# Patient Record
Sex: Female | Born: 1975
Health system: Southern US, Community
[De-identification: ages and names within clinical notes are randomized; demographics above are authoritative.]

## PROBLEM LIST (undated history)

## (undated) DIAGNOSIS — I10 Essential (primary) hypertension: Secondary | ICD-10-CM

## (undated) DIAGNOSIS — K219 Gastro-esophageal reflux disease without esophagitis: Secondary | ICD-10-CM

## (undated) HISTORY — PX: TONSILLECTOMY: SUR1361

## (undated) HISTORY — PX: COLONOSCOPY: SHX5424

## (undated) HISTORY — DX: Gastro-esophageal reflux disease without esophagitis: K21.9

---

## 2003-11-21 HISTORY — PX: UPPER GASTROINTESTINAL ENDOSCOPY: SHX188

## 2011-10-11 ENCOUNTER — Emergency Department (HOSPITAL_BASED_OUTPATIENT_CLINIC_OR_DEPARTMENT_OTHER)
Admission: EM | Admit: 2011-10-11 | Discharge: 2011-10-11 | Disposition: A | Discharge status: Home / Self Care | Payer: Managed Care, Other (non HMO) | Attending: Emergency Medicine | Admitting: Emergency Medicine

## 2011-10-11 ENCOUNTER — Encounter: Payer: Self-pay | Admitting: *Deleted

## 2011-10-11 ENCOUNTER — Emergency Department (INDEPENDENT_AMBULATORY_CARE_PROVIDER_SITE_OTHER): Payer: Managed Care, Other (non HMO)

## 2011-10-11 ENCOUNTER — Emergency Department (HOSPITAL_BASED_OUTPATIENT_CLINIC_OR_DEPARTMENT_OTHER): Payer: Managed Care, Other (non HMO)

## 2011-10-11 DIAGNOSIS — E785 Hyperlipidemia, unspecified: Secondary | ICD-10-CM | POA: Insufficient documentation

## 2011-10-11 DIAGNOSIS — N390 Urinary tract infection, site not specified: Secondary | ICD-10-CM | POA: Insufficient documentation

## 2011-10-11 DIAGNOSIS — I10 Essential (primary) hypertension: Secondary | ICD-10-CM | POA: Insufficient documentation

## 2011-10-11 DIAGNOSIS — R109 Unspecified abdominal pain: Secondary | ICD-10-CM | POA: Insufficient documentation

## 2011-10-11 DIAGNOSIS — F17200 Nicotine dependence, unspecified, uncomplicated: Secondary | ICD-10-CM | POA: Insufficient documentation

## 2011-10-11 DIAGNOSIS — N83209 Unspecified ovarian cyst, unspecified side: Secondary | ICD-10-CM | POA: Insufficient documentation

## 2011-10-11 DIAGNOSIS — K661 Hemoperitoneum: Secondary | ICD-10-CM

## 2011-10-11 HISTORY — DX: Essential (primary) hypertension: I10

## 2011-10-11 LAB — URINALYSIS, ROUTINE W REFLEX MICROSCOPIC
Hgb urine dipstick: NEGATIVE
Specific Gravity, Urine: 1.029 (ref 1.005–1.030)
Urobilinogen, UA: 4 mg/dL — ABNORMAL HIGH (ref 0.0–1.0)
pH: 5 (ref 5.0–8.0)

## 2011-10-11 LAB — BASIC METABOLIC PANEL
BUN: 11 mg/dL (ref 6–23)
Calcium: 8.8 mg/dL (ref 8.4–10.5)
GFR calc Af Amer: >90 mL/min (ref >90)
GFR calc non Af Amer: >90 mL/min (ref >90)
Glucose, Bld: 97 mg/dL (ref 70–99)
Potassium: 3.5 mEq/L (ref 3.5–5.1)
Sodium: 134 mEq/L — ABNORMAL LOW (ref 135–145)

## 2011-10-11 LAB — DIFFERENTIAL
Basophils Relative: 0 % (ref 0–1)
Eosinophils Absolute: 0.1 10*3/uL (ref 0.0–0.7)
Neutro Abs: 7.8 10*3/uL — ABNORMAL HIGH (ref 1.7–7.7)
Neutrophils Relative %: 63 % (ref 43–77)

## 2011-10-11 LAB — CBC
MCH: 31.8 pg (ref 26.0–34.0)
MCHC: 34.4 g/dL (ref 30.0–36.0)
Platelets: 312 10*3/uL (ref 150–400)
RBC: 4.02 MIL/uL (ref 3.87–5.11)

## 2011-10-11 LAB — HEPATIC FUNCTION PANEL
AST: 14 U/L (ref 0–37)
Albumin: 3.9 g/dL (ref 3.5–5.2)
Total Protein: 7.3 g/dL (ref 6.0–8.3)

## 2011-10-11 LAB — URINE MICROSCOPIC-ADD ON

## 2011-10-11 LAB — PREGNANCY, URINE: Preg Test, Ur: NEGATIVE

## 2011-10-11 MED ORDER — KETOROLAC TROMETHAMINE 30 MG/ML IJ SOLN
30.0000 mg | Freq: Once | INTRAMUSCULAR | Status: AC
  Administered 2011-10-11: 30 mg via INTRAVENOUS
  Filled 2011-10-11: qty 1

## 2011-10-11 MED ORDER — IBUPROFEN 800 MG PO TABS: 800.0000 mg | ORAL_TABLET | Freq: Three times a day (TID) | ORAL | Status: AC

## 2011-10-11 MED ORDER — OXYCODONE-ACETAMINOPHEN 5-325 MG PO TABS: 2.0000 | ORAL_TABLET | ORAL | Status: AC | PRN

## 2011-10-11 NOTE — ED Notes (Signed)
C/o sudden onset of right flank and suprapubic pains since this morning. Was seen at [facility] today, had urine specimen obtained and dx with kidney infection. Was prescribed Cipro and ?Pyridium without relief of pain. States she is having severe right sided flank pain that radiates into the suprapubic area. C/o pressure as well. Denies urinary symptoms. Here d/t continued severe pain. No hx of stones.

## 2011-10-11 NOTE — ED Notes (Signed)
Pt c/o lower abd and right flank pain since 4:00 am today. Pt sts pain worse with urination.

## 2011-10-11 NOTE — ED Provider Notes (Signed)
History     CSN: [id number] Arrival date & time: 10/11/2011  7:28 PM   First MD Initiated Contact with Patient 10/11/11 2010      Chief Complaint  Patient presents with  . Abdominal Pain    (Consider location/radiation/quality/duration/timing/severity/associated sxs/prior treatment) HPI Comments: Pt states that she was seen today at [facility],but didn't like the care and the pain in her flank pain has worsened so she felt like she need to be seen:pt state that she has not had any urinary symptoms or fever  Patient is a 35 y.o. female presenting with abdominal pain. The history is provided by the patient.  Abdominal Pain The primary symptoms of the illness include abdominal pain. The primary symptoms of the illness do not include fever, nausea, vomiting, dysuria, vaginal discharge or vaginal bleeding. The current episode started 13 to 24 hours ago. The onset of the illness was sudden. The problem has been gradually worsening.  The patient states that she believes she is currently not pregnant. The patient has not had a change in bowel habit. Additional symptoms associated with the illness include back pain. Symptoms associated with the illness do not include urgency or hematuria.    Past Medical History  Diagnosis Date  . Hypertension   . Hyperlipidemia     Past Surgical History  Procedure Date  . Tonsillectomy     No family history on file.  History  Substance Use Topics  . Smoking status: Current Everyday Smoker  . Smokeless tobacco: Not on file  . Alcohol Use: Yes    OB History    Grav Para Term Preterm Abortions TAB SAB Ect Mult Living                  Review of Systems  Constitutional: Negative for fever.  Gastrointestinal: Positive for abdominal pain. Negative for nausea and vomiting.  Genitourinary: Negative for dysuria, urgency, hematuria, vaginal bleeding and vaginal discharge.  Musculoskeletal: Positive for back pain.  All other systems reviewed  and are negative.    Allergies  Vicodin  Home Medications   Current Outpatient Rx  Name Route Sig Dispense Refill  . CIPROFLOXACIN HCL 500 MG PO TABS Oral Take 500 mg by mouth 2 (two) times daily.      Marland Kitchen PHENAZOPYRIDINE HCL 200 MG PO TABS Oral Take 200 mg by mouth 3 (three) times daily.        BP 136/83  Pulse 73  Temp(Src) 97.5 F (36.4 C) (Oral)  Resp 18  SpO2 100%  LMP 10/06/2011  Physical Exam  Nursing note and vitals reviewed. Constitutional: She is oriented to person, place, and time. She appears well-developed and well-nourished.  HENT:  Head: Normocephalic.  Cardiovascular: Regular rhythm.   Pulmonary/Chest: Effort normal and breath sounds normal.  Abdominal: Soft. There is tenderness in the right lower quadrant and left lower quadrant.  Musculoskeletal: Normal range of motion.  Neurological: She is alert and oriented to person, place, and time.  Skin: Skin is warm and dry.  Psychiatric: She has a normal mood and affect.    ED Course  Procedures (including critical care time)  Labs Reviewed  URINALYSIS, ROUTINE W REFLEX MICROSCOPIC - Abnormal; Notable for the following:    Color, Urine RED (*) BIOCHEMICALS MAY BE AFFECTED BY COLOR   Appearance TURBID (*)    Bilirubin Urine MODERATE (*)    Ketones, ur 40 (*)    Protein, ur 30 (*)    Urobilinogen, UA 4.0 (*)  Nitrite POSITIVE (*)    Leukocytes, UA LARGE (*)    All other components within normal limits  URINE MICROSCOPIC-ADD ON - Abnormal; Notable for the following:    Squamous Epithelial / LPF FEW (*)    Bacteria, UA FEW (*)    All other components within normal limits  BASIC METABOLIC PANEL - Abnormal; Notable for the following:    Sodium 134 (*)    All other components within normal limits  CBC - Abnormal; Notable for the following:    WBC 12.3 (*)    All other components within normal limits  DIFFERENTIAL - Abnormal; Notable for the following:    Neutro Abs 7.8 (*)    All other components  within normal limits  PREGNANCY, URINE  HEPATIC FUNCTION PANEL   Ct Abdomen Pelvis W Wo Contrast  10/11/2011  *RADIOLOGY REPORT*  Clinical Data: Right flank pain radiating into the suprapubic region.  CT ABDOMEN AND PELVIS WITHOUT AND WITH CONTRAST 10/11/2011:  Technique:  Multidetector CT imaging of the abdomen and pelvis was performed without contrast material in both body regions, followed by contrast material(s) and further sections in both body regions. (Originally, this was performed as an unenhanced study, but because of findings below, the patient returned for a contrasted examination.)  Contrast:  100 ml Omnipaque-300 IV.  Comparison: None.  Findings: Original unenhanced images demonstrated a large amount of high attenuation fluid dependently in the pelvis, in the left paracolic gutter, and adjacent to the spleen, consistent with hemoperitoneum.  Minimal hemoperitoneum also present adjacent to the inferior liver.  No opaque urinary tract calculi identified. Enhanced images demonstrate a normal appearing liver and spleen without evidence of laceration or rupture.  Uterus enhances normally.  Bilateral ovarian cysts suspected. Urinary bladder unremarkable.  Phleboliths low in the pelvis.  Normal appearing liver, pancreas, adrenal glands, and kidneys. Gallbladder unremarkable by CT.  No biliary ductal dilation.  No visible aorto-iliofemoral atherosclerosis.  No significant lymphadenopathy.  Normal-appearing stomach, small bowel, and colon.  Normal appendix in the right mid abdomen, as the cecum is positioned in the right upper quadrant.  Visualized lung bases clear.  Bone window images demonstrate mild lower thoracic spondylosis and mild to moderate facet degenerative changes at L5-S1.  IMPRESSION:  1.  Moderate amount of hemoperitoneum, without evidence of laceration of the liver or spleen.  Therefore, the most likely etiology is a ruptured ovarian hemorrhagic cyst or bleeding related to an  endometriosis. 2.  Otherwise normal examination.  No evidence of urinary tract calculi.  Original Report Authenticated By: Arnell Sieving, M.D.     1. Ruptured ovarian cyst   2. Hemoperitoneum   3. UTI (lower urinary tract infection)       MDM  Spoke with Dr. Emelda Fear with ob teaching service and he said that it is symptomatic treatment and to explain symptoms that would warrant return        Teressa Lower, NP 10/11/11 2207

## 2011-10-12 NOTE — ED Provider Notes (Signed)
Medical screening examination/treatment/procedure(s) were performed by non-physician practitioner and as supervising physician I was immediately available for consultation/collaboration.   Rolan Bucco, MD 10/12/11 [phone]

## 2015-03-29 ENCOUNTER — Encounter (HOSPITAL_BASED_OUTPATIENT_CLINIC_OR_DEPARTMENT_OTHER): Payer: Self-pay

## 2015-03-29 ENCOUNTER — Emergency Department (HOSPITAL_BASED_OUTPATIENT_CLINIC_OR_DEPARTMENT_OTHER)
Admission: EM | Admit: 2015-03-29 | Discharge: 2015-03-29 | Disposition: A | Discharge status: Home / Self Care | Payer: Managed Care, Other (non HMO) | Attending: Emergency Medicine | Admitting: Emergency Medicine

## 2015-03-29 DIAGNOSIS — K921 Melena: Secondary | ICD-10-CM | POA: Diagnosis not present

## 2015-03-29 DIAGNOSIS — Z79899 Other long term (current) drug therapy: Secondary | ICD-10-CM | POA: Diagnosis not present

## 2015-03-29 DIAGNOSIS — K5900 Constipation, unspecified: Secondary | ICD-10-CM | POA: Diagnosis not present

## 2015-03-29 DIAGNOSIS — N61 Inflammatory disorders of breast: Secondary | ICD-10-CM | POA: Diagnosis not present

## 2015-03-29 DIAGNOSIS — N611 Abscess of the breast and nipple: Secondary | ICD-10-CM

## 2015-03-29 DIAGNOSIS — K625 Hemorrhage of anus and rectum: Secondary | ICD-10-CM | POA: Diagnosis present

## 2015-03-29 DIAGNOSIS — L0292 Furuncle, unspecified: Secondary | ICD-10-CM

## 2015-03-29 DIAGNOSIS — I10 Essential (primary) hypertension: Secondary | ICD-10-CM | POA: Diagnosis not present

## 2015-03-29 DIAGNOSIS — R198 Other specified symptoms and signs involving the digestive system and abdomen: Secondary | ICD-10-CM

## 2015-03-29 DIAGNOSIS — L0293 Carbuncle, unspecified: Secondary | ICD-10-CM

## 2015-03-29 DIAGNOSIS — Z792 Long term (current) use of antibiotics: Secondary | ICD-10-CM | POA: Insufficient documentation

## 2015-03-29 DIAGNOSIS — Z720 Tobacco use: Secondary | ICD-10-CM | POA: Diagnosis not present

## 2015-03-29 DIAGNOSIS — Z3202 Encounter for pregnancy test, result negative: Secondary | ICD-10-CM | POA: Insufficient documentation

## 2015-03-29 LAB — COMPREHENSIVE METABOLIC PANEL
ALT: 16 U/L (ref 14–54)
AST: 15 U/L (ref 15–41)
Albumin: 3.7 g/dL (ref 3.5–5.0)
Alkaline Phosphatase: 94 U/L (ref 38–126)
Anion gap: 7 (ref 5–15)
BUN: 13 mg/dL (ref 6–20)
CO2: 26 mmol/L (ref 22–32)
Calcium: 8.8 mg/dL — ABNORMAL LOW (ref 8.9–10.3)
Chloride: 106 mmol/L (ref 101–111)
Creatinine, Ser: 0.83 mg/dL (ref 0.44–1.00)
GFR calc Af Amer: >60 mL/min (ref >60)
GFR calc non Af Amer: >60 mL/min (ref >60)
Glucose, Bld: 108 mg/dL — ABNORMAL HIGH (ref 70–99)
Potassium: 3.8 mmol/L (ref 3.5–5.1)
Sodium: 139 mmol/L (ref 135–145)
Total Bilirubin: 0.3 mg/dL (ref 0.3–1.2)
Total Protein: 6.9 g/dL (ref 6.5–8.1)

## 2015-03-29 LAB — CBC WITH DIFFERENTIAL/PLATELET
Basophils Absolute: 0 10*3/uL (ref 0.0–0.1)
Basophils Relative: 0 % (ref 0–1)
Eosinophils Absolute: 0.2 10*3/uL (ref 0.0–0.7)
Eosinophils Relative: 3 % (ref 0–5)
HCT: 43.3 % (ref 36.0–46.0)
Hemoglobin: 14.5 g/dL (ref 12.0–15.0)
Lymphocytes Relative: 31 % (ref 12–46)
Lymphs Abs: 2.2 10*3/uL (ref 0.7–4.0)
MCH: 31.7 pg (ref 26.0–34.0)
MCHC: 33.5 g/dL (ref 30.0–36.0)
MCV: 94.5 fL (ref 78.0–100.0)
Monocytes Absolute: 0.5 10*3/uL (ref 0.1–1.0)
Monocytes Relative: 6 % (ref 3–12)
Neutro Abs: 4.4 10*3/uL (ref 1.7–7.7)
Neutrophils Relative %: 60 % (ref 43–77)
Platelets: 347 10*3/uL (ref 150–400)
RBC: 4.58 MIL/uL (ref 3.87–5.11)
RDW: 12 % (ref 11.5–15.5)
WBC: 7.3 10*3/uL (ref 4.0–10.5)

## 2015-03-29 LAB — URINALYSIS, ROUTINE W REFLEX MICROSCOPIC
BILIRUBIN URINE: NEGATIVE
Glucose, UA: NEGATIVE mg/dL
Hgb urine dipstick: NEGATIVE
KETONES UR: NEGATIVE mg/dL
NITRITE: NEGATIVE
Protein, ur: NEGATIVE mg/dL
Specific Gravity, Urine: 1.023 (ref 1.005–1.030)
UROBILINOGEN UA: 0.2 mg/dL (ref 0.0–1.0)
pH: 6 (ref 5.0–8.0)

## 2015-03-29 LAB — URINE MICROSCOPIC-ADD ON

## 2015-03-29 LAB — OCCULT BLOOD X 1 CARD TO LAB, STOOL: Fecal Occult Bld: POSITIVE — AB

## 2015-03-29 LAB — PREGNANCY, URINE: Preg Test, Ur: NEGATIVE

## 2015-03-29 MED ORDER — CLINDAMYCIN HCL 150 MG PO CAPS: 150.0000 mg | ORAL_CAPSULE | Freq: Four times a day (QID) | ORAL | Status: DC

## 2015-03-29 NOTE — ED Provider Notes (Signed)
CSN: [id number]     Arrival date & time 03/29/15  1522 History   First MD Initiated Contact with Patient 03/29/15 1618     Chief Complaint  Patient presents with  . Rectal Bleeding     (Consider location/radiation/quality/duration/timing/severity/associated sxs/prior Treatment) HPI Pt is a 39yo female with hx of HTN and hyperlipidemia, presenting to ED with c/o intermittent heavy rectal bleeding with bright red blood, gradually worsening over last 2 months, associated abdominal bloating and mild diffuse cramping. Pt reports having irregular BMs with loose stool and constipation for several years but has never been seen by a GI specialist. Pt does not currently have a PCP.  Has never been seen for rectal bleeding in the past.  Denies hx of abdominal surgery.  Denies urinary or vaginal symptoms.  Denies fever, chills, nausea or vomiting.    Pt also reports having an unrelated issue with recurrent boils under her breasts.  Pt states areas are painful, red, irritated, occasionally drain on their own but have started to form scars and become more painful. Reports adding pads to her bras for comfort.  States this issue has been going on for several months and has been seen by a PCP in the past for same but was advised not much could be done to prevent the abscesses. Denies personal hx of DM. Denise any other boils or rashes. Denies nipple discharge or pain.    Past Medical History  Diagnosis Date  . Hypertension   . Hyperlipidemia    Past Surgical History  Procedure Laterality Date  . Tonsillectomy     No family history on file. History  Substance Use Topics  . Smoking status: Current Every Day Smoker  . Smokeless tobacco: Not on file  . Alcohol Use: Yes   OB History    No data available     Review of Systems  Constitutional: Negative for fever, chills, diaphoresis, appetite change and fatigue.  Respiratory: Negative for cough, chest tightness and shortness of breath.   Cardiovascular:  Positive for chest pain ( bilateral breast: recurrent abscesses). Negative for palpitations and leg swelling.  Gastrointestinal: Positive for abdominal pain ( mild intermittent cramping), diarrhea, constipation, blood in stool and anal bleeding. Negative for nausea, vomiting and rectal pain.  Genitourinary: Negative for dysuria, hematuria, flank pain, vaginal bleeding, vaginal discharge and menstrual problem.  Musculoskeletal: Negative for myalgias and back pain.  All other systems reviewed and are negative.     Allergies  Vicodin  Home Medications   Prior to Admission medications   Medication Sig Start Date End Date Taking? Authorizing Provider  ciprofloxacin (CIPRO) 500 MG tablet Take 500 mg by mouth 2 (two) times daily.      Historical Provider, MD  clindamycin (CLEOCIN) 150 MG capsule Take 1 capsule (150 mg total) by mouth every 6 (six) hours. 03/29/15   Noland Fordyce, PA-C  phenazopyridine (PYRIDIUM) 200 MG tablet Take 200 mg by mouth 3 (three) times daily.      Historical Provider, MD   BP 117/96 mmHg  Pulse 70  Temp(Src) 98.5 F (36.9 C)  Resp 18  Ht 5\' 6"  (1.676 m)  Wt 222 lb (100.699 kg)  BMI 35.85 kg/m2  SpO2 98% Physical Exam  Constitutional: She appears well-developed and well-nourished. No distress.  HENT:  Head: Normocephalic and atraumatic.  Eyes: Conjunctivae are normal. No scleral icterus.  Neck: Normal range of motion. Neck supple.  Cardiovascular: Normal rate, regular rhythm and normal heart sounds.   Pulmonary/Chest: Effort normal  and breath sounds normal. No respiratory distress. She has no wheezes. She has no rales. She exhibits tenderness. Right breast exhibits mass, skin change and tenderness. Right breast exhibits no inverted nipple and no nipple discharge. Left breast exhibits mass, skin change and tenderness. Left breast exhibits no inverted nipple and no nipple discharge.    Large breasts, 2 areas of erythema and induration with overlying scaring and  tenderness on left and right lower breasts, medial aspects. Scant discharge from mass on right breast.   No deep masses. No nipple or areola involvement.  Lesions c/w cutaneous abscesses   Abdominal: Soft. Bowel sounds are normal. She exhibits no distension and no mass. There is no tenderness. There is no rebound and no guarding.  Genitourinary: Rectal exam shows no mass and no tenderness. Guaiac positive stool.  Chaperoned exam. No frank blood noted on exam.   Musculoskeletal: Normal range of motion.  Neurological: She is alert.  Skin: Skin is warm and dry. She is not diaphoretic.  Nursing note and vitals reviewed.   ED Course  Procedures (including critical care time) Labs Review Labs Reviewed  URINALYSIS, ROUTINE W REFLEX MICROSCOPIC - Abnormal; Notable for the following:    APPearance CLOUDY (*)    Leukocytes, UA SMALL (*)    All other components within normal limits  COMPREHENSIVE METABOLIC PANEL - Abnormal; Notable for the following:    Glucose, Bld 108 (*)    Calcium 8.8 (*)    All other components within normal limits  OCCULT BLOOD X 1 CARD TO LAB, STOOL - Abnormal; Notable for the following:    Fecal Occult Bld POSITIVE (*)    All other components within normal limits  PREGNANCY, URINE  URINE MICROSCOPIC-ADD ON  CBC WITH DIFFERENTIAL/PLATELET    Imaging Review No results found.   EKG Interpretation None      MDM   Final diagnoses:  Hematochezia  Irregular bowel habits  Recurrent boils  Breast abscess of female   Pt is a 39yo female c/o 2 month hx of rectal bleeding. Pt appears well, non-toxic, afebrile. Abdomen is soft, non-tender. Rectal exam: positive for hemoccult blood but no frank red blood on exam.  Labs unremarkable. Strongly encouraged pt to f/u with PCP and GI to schedule a colonoscopy and outpatient workup for GI bleed, as pt is hemodynamically stable for discharge home. Hgb/Hct: 14.5/43.3 BP: 117/96, HR: 70  Pt also reports recurrent abscesses  on her breasts. Due to multiple small abscesses will tx with clindamycin.  Encouraged pt to f/u with PCP and OB/GYN for continued treatment of recurrent abscesses. Home care instructions provided.   Return precautions provided. Pt verbalized understanding and agreement with tx plan.     Noland Fordyce, PA-C 03/30/15 [place], MD 03/31/15 [phone]

## 2015-03-29 NOTE — ED Notes (Signed)
Reports rectal bleeding x 2 months. Reports bloating. Denies abdominal pain. Reports frank blood in bowel movements.

## 2015-03-29 NOTE — Discharge Instructions (Signed)
Abscess °An abscess (boil or furuncle) is an infected area on or under the skin. This area is filled with yellowish-white fluid (pus) and other material (debris). °HOME CARE  °· Only take medicines as told by your doctor. °· If you were given antibiotic medicine, take it as directed. Finish the medicine even if you start to feel better. °· If gauze is used, follow your doctor's directions for changing the gauze. °· To avoid spreading the infection: °¨ Keep your abscess covered with a bandage. °¨ Wash your hands well. °¨ Do not share personal care items, towels, or whirlpools with others. °¨ Avoid skin contact with others. °· Keep your skin and clothes clean around the abscess. °· Keep all doctor visits as told. °GET HELP RIGHT AWAY IF:  °· You have more pain, puffiness (swelling), or redness in the wound site. °· You have more fluid or blood coming from the wound site. °· You have muscle aches, chills, or you feel sick. °· You have a fever. °MAKE SURE YOU:  °· Understand these instructions. °· Will watch your condition. °· Will get help right away if you are not doing well or get worse. °Document Released: 04/24/2008 Document Revised: 05/07/2012 Document Reviewed: 01/19/2012 °ExitCare® Patient Information ©2015 [employer]. This information is not intended to replace advice given to you by your health care provider. Make sure you discuss any questions you have with your health care provider. ° °

## 2015-03-31 ENCOUNTER — Encounter: Payer: Self-pay | Admitting: Gastroenterology

## 2015-05-25 ENCOUNTER — Encounter: Payer: Self-pay | Admitting: Gastroenterology

## 2015-05-25 ENCOUNTER — Ambulatory Visit (INDEPENDENT_AMBULATORY_CARE_PROVIDER_SITE_OTHER): Payer: Managed Care, Other (non HMO) | Admitting: Gastroenterology

## 2015-05-25 VITALS — BP 120/70 | HR 68 | Ht 65.25 in | Wt 220.0 lb

## 2015-05-25 DIAGNOSIS — K625 Hemorrhage of anus and rectum: Secondary | ICD-10-CM

## 2015-05-25 MED ORDER — MOVIPREP 100 G PO SOLR: 1.0000 | Freq: Once | ORAL | Status: DC

## 2015-05-25 NOTE — Progress Notes (Signed)
  HPI: This is a   very pleasant 39 year old woman whom I am meeting for the first time today  Chief complaint is alternating bowels, abdominal discomfort, rectal bleeding  Labs 03/2015 cbc, cmet were both normal.  Rectal bleeding, intermittently over past few weeks.  Dripping blood in the toilet.  SHe alternates between constipation/diarrhea.  This is not competely new, but more pronounced.   Cramping pains in abd that improve with BM.  Last 2-3 weeks.  No colon disease in her family.  Overall her weight is stable.    Bloating a lot lately.  Having a BM almost always improves her discomfort  Review of systems: Pertinent positive and negative review of systems were noted in the above HPI section. Complete review of systems was performed and was otherwise normal.   Past Medical History  Diagnosis Date  . Hypertension     Past Surgical History  Procedure Laterality Date  . Tonsillectomy      Current Outpatient Prescriptions  Medication Sig Dispense Refill  . citalopram (CELEXA) 10 MG tablet Take 10 mg by mouth daily.     No current facility-administered medications for this visit.    Allergies as of 05/25/2015 - Review Complete 05/25/2015  Allergen Reaction Noted  . Vicodin [hydrocodone-acetaminophen] Itching 10/11/2011    History reviewed. No pertinent family history.  History   Social History  . Marital Status: Single    Spouse Name: N/A  . Number of Children: N/A  . Years of Education: N/A   Occupational History  . 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 Smoking status: Current Every Day Smoker -- 1.00 packs/day    Types: Cigarettes    Start date: 11/20/1990  . Smokeless tobacco: Not on file  . Alcohol Use: 0.6 oz/week    1 Standard drinks or equivalent per week  . Drug Use: No  . Sexual Activity: Not on file   Other Topics Concern  . Not on file   Social History Narrative     Physical Exam: Ht 5' 5.25" (1.657 m)  Wt 220 lb  (99.791 kg)  BMI 36.35 kg/m2 Constitutional: generally well-appearing Psychiatric: alert and oriented x3 Eyes: extraocular movements intact Mouth: oral pharynx moist, no lesions Neck: supple no lymphadenopathy Cardiovascular: heart regular rate and rhythm Lungs: clear to auscultation bilaterally Abdomen: soft, nontender, nondistended, no obvious ascites, no peritoneal signs, normal bowel sounds Extremities: no lower extremity edema bilaterally Skin: no lesions on visible extremities Rectal examination with female assistant in the room: Small to medium sized external hemorrhoids that were not thrombosed, digital rectal exam revealed no stool in the vault and no palpable distal rectal masses  Assessment and plan: 39 y.o. female with  hemorrhoids, alternating bowel habits, IBS-like pains, rectal bleeding  I would like to proceed with colonoscopy at her soonest convenience to exclude neoplastic, other causes of her minor rectal bleeding which likely is hemorrhoidal. For her alternating bowel habits I recommended that she try fiber supplements on a daily basis. She understands this may worsen her bloating symptoms initially but hopefully those will get better as she moves her bowels more regularly without several days of constipation. I see no reason for any further blood tests or imaging studies   Owens Loffler, MD [facility] 05/25/2015, 2:27 PM  Cc:

## 2015-05-25 NOTE — Patient Instructions (Addendum)
One of your biggest health concerns is your smoking.  This increases your risk for most cancers and serious cardiovascular diseases such as strokes, heart attacks.  You should try your best to stop.  If you need assistance, please contact your PCP or Smoking Cessation Class at [facility] [phone]) or [facility] (1-800-QUIT-NOW). Please start taking citrucel (orange flavored) powder fiber supplement.  This may cause some bloating at first but that usually goes away. Begin with a small spoonful and work your way up to a large, heaping spoonful daily over a week. You will be set up for a colonoscopy for rectal bleeding.

## 2015-07-21 ENCOUNTER — Encounter: Payer: Self-pay | Admitting: Gastroenterology

## 2015-07-21 ENCOUNTER — Ambulatory Visit (AMBULATORY_SURGERY_CENTER): Payer: Managed Care, Other (non HMO) | Admitting: Gastroenterology

## 2015-07-21 VITALS — BP 128/85 | HR 68 | Temp 96.6°F | Resp 13 | Ht 65.25 in | Wt 220.0 lb

## 2015-07-21 DIAGNOSIS — K621 Rectal polyp: Secondary | ICD-10-CM | POA: Diagnosis not present

## 2015-07-21 DIAGNOSIS — K625 Hemorrhage of anus and rectum: Secondary | ICD-10-CM | POA: Diagnosis present

## 2015-07-21 DIAGNOSIS — D128 Benign neoplasm of rectum: Secondary | ICD-10-CM

## 2015-07-21 DIAGNOSIS — K640 First degree hemorrhoids: Secondary | ICD-10-CM

## 2015-07-21 DIAGNOSIS — D129 Benign neoplasm of anus and anal canal: Secondary | ICD-10-CM

## 2015-07-21 MED ORDER — SODIUM CHLORIDE 0.9 % IV SOLN: 500.0000 mL | INTRAVENOUS | Status: DC

## 2015-07-21 NOTE — Op Note (Signed)
[place]  [address] [place], [id number]   COLONOSCOPY PROCEDURE REPORT  PATIENT: Shawna Harper, Shawna Harper  MR#: [id number] BIRTHDATE: 01-Sep-1976 , 39  yrs. old GENDER: female ENDOSCOPIST: Milus Banister, MD PROCEDURE DATE:  07/21/2015 PROCEDURE:   Colonoscopy, diagnostic and Colonoscopy with snare polypectomy First Screening Colonoscopy - Avg.  risk and is 50 yrs.  old or older - No.  Prior Negative Screening - Now for repeat screening. N/A  History of Adenoma - Now for follow-up colonoscopy & has been > or = to 3 yrs.  N/A  Polyps removed today? Yes ASA CLASS:   Class II INDICATIONS:rectal bleeding. MEDICATIONS: Monitored anesthesia care and Propofol 300 mg IV  DESCRIPTION OF PROCEDURE:   After the risks benefits and alternatives of the procedure were thoroughly explained, informed consent was obtained.  The digital rectal exam revealed no abnormalities of the rectum.   The LB PCF Q180 [id number]  endoscope was introduced through the anus and advanced to the cecum, which was identified by both the appendix and ileocecal valve. No adverse events experienced.   The quality of the prep was excellent.  The instrument was then slowly withdrawn as the colon was fully examined. Estimated blood loss is zero unless otherwise noted in this procedure report.  COLON FINDINGS: A sessile polyp measuring 3 mm in size was found in the rectum.  A polypectomy was performed with a cold snare.  The resection was complete, the polyp tissue was completely retrieved and sent to histology.   Moderate sized external hemorrhoids were found.   The examination was otherwise normal.  Retroflexed views revealed no abnormalities. The time to cecum = 1.3 Withdrawal time = 9.3   The scope was withdrawn and the procedure completed. COMPLICATIONS: There were no immediate complications.  ENDOSCOPIC IMPRESSION: 1.   Sessile polyp was found in the rectum; polypectomy was performed with a cold  snare 2.   Moderate sized external hemorrhoids 3.   The examination was otherwise normal  RECOMMENDATIONS: If the polyp(s) removed today are proven to be adenomatous (pre-cancerous) polyps, you will need a repeat colonoscopy in 5 years.  Otherwise you should continue to follow colorectal cancer screening guidelines for "routine risk" patients with colonoscopy in 10 years.  You will receive a letter within 1-2 weeks with the results of your biopsy as well as final recommendations.  Please call my office if you have not received a letter after 3 weeks. Please try to take the fiber supplement on a daily basis rather than just 3-4 times per week. It will be more effective this way.  eSigned:  Milus Banister, MD 07/21/2015 9:53 AM

## 2015-07-21 NOTE — Progress Notes (Signed)
Transferred to recovery room. A/O x3, pleased with MAC.  VSS.  Report to Shelia, RN. 

## 2015-07-21 NOTE — Progress Notes (Signed)
Called to room to assist during endoscopic procedure.  Patient ID and intended procedure confirmed with present staff. Received instructions for my participation in the procedure from the performing physician.  

## 2015-07-21 NOTE — Patient Instructions (Addendum)
One of your biggest health concerns is your smoking.  This increases your risk for most cancers and serious cardiovascular diseases such as strokes, heart attacks.  You should try your best to stop.  If you need assistance, please contact your PCP or Smoking Cessation Class at [facility] [phone]) or [place] (1-800-QUIT-NOW).      YOU HAD AN ENDOSCOPIC PROCEDURE TODAY AT [place]  [facility]:   Refer to the procedure report that was given to you for any specific questions about what was found during the examination.  If the procedure report does not answer your questions, please call your gastroenterologist to clarify.  If you requested that your care partner not be given the details of your procedure findings, then the procedure report has been included in a sealed envelope for you to review at your convenience later.  YOU SHOULD EXPECT: Some feelings of bloating in the abdomen. Passage of more gas than usual.  Walking can help get rid of the air that was put into your GI tract during the procedure and reduce the bloating. If you had a lower endoscopy (such as a colonoscopy or flexible sigmoidoscopy) you may notice spotting of blood in your stool or on the toilet paper. If you underwent a bowel prep for your procedure, you may not have a normal bowel movement for a few days.  Please Note:  You might notice some irritation and congestion in your nose or some drainage.  This is from the oxygen used during your procedure.  There is no need for concern and it should clear up in a day or so.  SYMPTOMS TO REPORT IMMEDIATELY:   Following lower endoscopy (colonoscopy or flexible sigmoidoscopy):  Excessive amounts of blood in the stool  Significant tenderness or worsening of abdominal pains  Swelling of the abdomen that is new, acute  Fever of 100F or higher    For urgent or emergent issues, a gastroenterologist can be reached at any hour by calling [phone]  [phone].   DIET: Your first meal following the procedure should be a small meal and then it is ok to progress to your normal diet. Heavy or fried foods are harder to digest and may make you feel nauseous or bloated.  Likewise, meals heavy in dairy and vegetables can increase bloating.  Drink plenty of fluids but you should avoid alcoholic beverages for 24 hours.  ACTIVITY:  You should plan to take it easy for the rest of today and you should NOT DRIVE or use heavy machinery until tomorrow (because of the sedation medicines used during the test).    FOLLOW UP: Our staff will call the number listed on your records the next business day following your procedure to check on you and address any questions or concerns that you may have regarding the information given to you following your procedure. If we do not reach you, we will leave a message.  However, if you are feeling well and you are not experiencing any problems, there is no need to return our call.  We will assume that you have returned to your regular daily activities without incident.  If any biopsies were taken you will be contacted by phone or by letter within the next 1-3 weeks.  Please call us at [phone] if you have not heard about the biopsies in 3 weeks.    SIGNATURES/CONFIDENTIALITY: You and/or your care partner have signed paperwork which will be entered into your electronic medical record.  These signatures  attest to the fact that that the information above on your After Visit Summary has been reviewed and is understood.  Full responsibility of the confidentiality of this discharge information lies with you and/or your care-partner.   Resume medications. Information given on polyps and hemorrhoids.

## 2015-07-22 ENCOUNTER — Telehealth: Payer: Self-pay

## 2015-07-22 NOTE — Telephone Encounter (Signed)
  Follow up Call-  Call back number 07/21/2015  Post procedure Call Back phone  # [phone]  Permission to leave phone message Yes     Patient questions:  Do you have a fever, pain , or abdominal swelling? No. Pain Score  0 *  Have you tolerated food without any problems? Yes.    Have you been able to return to your normal activities? Yes.    Do you have any questions about your discharge instructions: Diet   No. Medications  No. Follow up visit  No.  Do you have questions or concerns about your Care? No.  Actions: * If pain score is 4 or above: No action needed, pain <4.

## 2015-08-02 ENCOUNTER — Encounter: Payer: Self-pay | Admitting: Gastroenterology

## 2016-03-27 ENCOUNTER — Encounter (HOSPITAL_BASED_OUTPATIENT_CLINIC_OR_DEPARTMENT_OTHER): Payer: Self-pay | Admitting: *Deleted

## 2016-03-27 ENCOUNTER — Emergency Department (HOSPITAL_BASED_OUTPATIENT_CLINIC_OR_DEPARTMENT_OTHER): Payer: Managed Care, Other (non HMO)

## 2016-03-27 ENCOUNTER — Emergency Department (HOSPITAL_BASED_OUTPATIENT_CLINIC_OR_DEPARTMENT_OTHER)
Admission: EM | Admit: 2016-03-27 | Discharge: 2016-03-27 | Disposition: A | Discharge status: Home / Self Care | Payer: Managed Care, Other (non HMO) | Attending: Emergency Medicine | Admitting: Emergency Medicine

## 2016-03-27 DIAGNOSIS — R102 Pelvic and perineal pain: Secondary | ICD-10-CM

## 2016-03-27 DIAGNOSIS — R1909 Other intra-abdominal and pelvic swelling, mass and lump: Secondary | ICD-10-CM | POA: Diagnosis present

## 2016-03-27 DIAGNOSIS — I10 Essential (primary) hypertension: Secondary | ICD-10-CM | POA: Diagnosis not present

## 2016-03-27 DIAGNOSIS — B9689 Other specified bacterial agents as the cause of diseases classified elsewhere: Secondary | ICD-10-CM

## 2016-03-27 DIAGNOSIS — N760 Acute vaginitis: Secondary | ICD-10-CM | POA: Insufficient documentation

## 2016-03-27 DIAGNOSIS — F17210 Nicotine dependence, cigarettes, uncomplicated: Secondary | ICD-10-CM | POA: Insufficient documentation

## 2016-03-27 LAB — GC/CHLAMYDIA PROBE AMP (~~LOC~~) NOT AT ARMC
CHLAMYDIA, DNA PROBE: NEGATIVE
NEISSERIA GONORRHEA: NEGATIVE

## 2016-03-27 LAB — URINALYSIS, ROUTINE W REFLEX MICROSCOPIC
GLUCOSE, UA: NEGATIVE mg/dL
Hgb urine dipstick: NEGATIVE
Ketones, ur: NEGATIVE mg/dL
LEUKOCYTES UA: NEGATIVE
NITRITE: NEGATIVE
PH: 5.5 (ref 5.0–8.0)
Protein, ur: 30 mg/dL — AB
SPECIFIC GRAVITY, URINE: 1.028 (ref 1.005–1.030)

## 2016-03-27 LAB — BASIC METABOLIC PANEL
Anion gap: 6 (ref 5–15)
BUN: 11 mg/dL (ref 6–20)
CHLORIDE: 107 mmol/L (ref 101–111)
CO2: 22 mmol/L (ref 22–32)
Calcium: 8.7 mg/dL — ABNORMAL LOW (ref 8.9–10.3)
Creatinine, Ser: 0.73 mg/dL (ref 0.44–1.00)
GFR calc non Af Amer: >60 mL/min (ref >60)
Glucose, Bld: 116 mg/dL — ABNORMAL HIGH (ref 65–99)
POTASSIUM: 3.7 mmol/L (ref 3.5–5.1)
SODIUM: 135 mmol/L (ref 135–145)

## 2016-03-27 LAB — URINE MICROSCOPIC-ADD ON: RBC / HPF: NONE SEEN RBC/hpf (ref 0–5)

## 2016-03-27 LAB — CBC WITH DIFFERENTIAL/PLATELET
Basophils Absolute: 0 10*3/uL (ref 0.0–0.1)
Basophils Relative: 0 %
Eosinophils Absolute: 0.1 10*3/uL (ref 0.0–0.7)
Eosinophils Relative: 1 %
HCT: 41.7 % (ref 36.0–46.0)
HEMOGLOBIN: 14.2 g/dL (ref 12.0–15.0)
LYMPHS ABS: 1.6 10*3/uL (ref 0.7–4.0)
LYMPHS PCT: 14 %
MCH: 32.3 pg (ref 26.0–34.0)
MCHC: 34.1 g/dL (ref 30.0–36.0)
MCV: 95 fL (ref 78.0–100.0)
Monocytes Absolute: 0.7 10*3/uL (ref 0.1–1.0)
Monocytes Relative: 6 %
NEUTROS PCT: 78 %
Neutro Abs: 8.7 10*3/uL — ABNORMAL HIGH (ref 1.7–7.7)
Platelets: 296 10*3/uL (ref 150–400)
RBC: 4.39 MIL/uL (ref 3.87–5.11)
RDW: 12.3 % (ref 11.5–15.5)
WBC: 11.1 10*3/uL — AB (ref 4.0–10.5)

## 2016-03-27 LAB — WET PREP, GENITAL
SPERM: NONE SEEN
Trich, Wet Prep: NONE SEEN
Yeast Wet Prep HPF POC: NONE SEEN

## 2016-03-27 LAB — PREGNANCY, URINE: Preg Test, Ur: NEGATIVE

## 2016-03-27 MED ORDER — METRONIDAZOLE 500 MG PO TABS: 500.0000 mg | ORAL_TABLET | Freq: Two times a day (BID) | ORAL | Status: DC

## 2016-03-27 MED FILL — metroNIDAZOLE 500 MG TABS: 500 | 7 days supply | Qty: 14 | Fill #0

## 2016-03-27 NOTE — ED Notes (Signed)
Patient states she felt pain last night while walking, and examined her vagina and felt a large lump on the inside of the wall of her vagina.

## 2016-03-27 NOTE — ED Provider Notes (Signed)
CSN: [id number]     Arrival date & time 03/27/16  [id number] History   First MD Initiated Contact with Patient 03/27/16 [phone]     Chief Complaint  Patient presents with  . Groin Swelling    vaginal swelling     (Consider location/radiation/quality/duration/timing/severity/associated sxs/prior Treatment) HPI  The pt has had a recent feeling of a lump in the vagina - she has some pain in this area when she coughs and some incontinence recently - no f/c/n/v - there is no vaginal bleeing, no d/c and no sexual activity in 3 years.  Sx are constant, mild, and peristent over time.  Past Medical History  Diagnosis Date  . Hypertension   . GERD (gastroesophageal reflux disease)    Past Surgical History  Procedure Laterality Date  . Tonsillectomy    . Upper gastrointestinal endoscopy  2005  . Colonoscopy     Family History  Problem Relation Age of Onset  . Colon cancer Neg Hx   . Stomach cancer Neg Hx    Social History  Substance Use Topics  . Smoking status: Current Every Day Smoker -- 1.00 packs/day    Types: Cigarettes    Start date: 11/20/1990  . Smokeless tobacco: None  . Alcohol Use: 0.6 oz/week    1 Standard drinks or equivalent per week     Comment: mixed drinks, one a week   OB History    No data available     Review of Systems  Constitutional: Negative for fever.  Respiratory: Negative for cough and shortness of breath.   Cardiovascular: Negative for chest pain.  Gastrointestinal: Negative for nausea, vomiting and abdominal pain.  Genitourinary: Positive for vaginal pain. Negative for dysuria, vaginal bleeding and vaginal discharge.  Musculoskeletal: Negative for back pain.      Allergies  Vicodin  Home Medications   Prior to Admission medications   Medication Sig Start Date End Date Taking? Authorizing Provider  citalopram (CELEXA) 10 MG tablet Take 10 mg by mouth daily.    Historical Provider, MD  metroNIDAZOLE (FLAGYL) 500 MG tablet Take 1 tablet (500 mg  total) by mouth 2 (two) times daily. 03/27/16   Noemi Chapel, MD   BP 153/110 mmHg  Pulse 99  Temp(Src) 98.1 F (36.7 C) (Oral)  Resp 16  Ht 5\' 6"  (1.676 m)  Wt 230 lb (104.327 kg)  BMI 37.14 kg/m2  SpO2 100% Physical Exam  Constitutional: She appears well-developed and well-nourished. No distress.  HENT:  Head: Normocephalic and atraumatic.  Mouth/Throat: Oropharynx is clear and moist. No oropharyngeal exudate.  Eyes: Conjunctivae and EOM are normal. Pupils are equal, round, and reactive to light. Right eye exhibits no discharge. Left eye exhibits no discharge. No scleral icterus.  Neck: Normal range of motion. Neck supple. No JVD present. No thyromegaly present.  Cardiovascular: Normal rate, regular rhythm, normal heart sounds and intact distal pulses.  Exam reveals no gallop and no friction rub.   No murmur heard. Pulmonary/Chest: Effort normal and breath sounds normal. No respiratory distress. She has no wheezes. She has no rales.  Abdominal: Soft. Bowel sounds are normal. She exhibits no distension and no mass. There is no tenderness.  Genitourinary:  Chaperone present for exam - small tissue mass posterior vagina, no CMT or masses, no adnexal ttp or masses, minimal d/c and IUD strings present.  No bleeding, no FB  Musculoskeletal: Normal range of motion. She exhibits no edema or tenderness.  Lymphadenopathy:    She has no cervical adenopathy.  Neurological: She is alert. Coordination normal.  Skin: Skin is warm and dry. No rash noted. No erythema.  Psychiatric: She has a normal mood and affect. Her behavior is normal.  Nursing note and vitals reviewed.   ED Course  Procedures (including critical care time) Labs Review Labs Reviewed  WET PREP, GENITAL - Abnormal; Notable for the following:    Clue Cells Wet Prep HPF POC PRESENT (*)    WBC, Wet Prep HPF POC MANY (*)    All other components within normal limits  URINALYSIS, ROUTINE W REFLEX MICROSCOPIC (NOT AT [facility]) -  Abnormal; Notable for the following:    Color, Urine AMBER (*)    APPearance CLOUDY (*)    Bilirubin Urine SMALL (*)    Protein, ur 30 (*)    All other components within normal limits  CBC WITH DIFFERENTIAL/PLATELET - Abnormal; Notable for the following:    WBC 11.1 (*)    Neutro Abs 8.7 (*)    All other components within normal limits  BASIC METABOLIC PANEL - Abnormal; Notable for the following:    Glucose, Bld 116 (*)    Calcium 8.7 (*)    All other components within normal limits  URINE MICROSCOPIC-ADD ON - Abnormal; Notable for the following:    Squamous Epithelial / LPF 0-5 (*)    Bacteria, UA FEW (*)    All other components within normal limits  PREGNANCY, URINE  GC/CHLAMYDIA PROBE AMP ([place]) NOT AT [facility]    Imaging Review US Transvaginal Non-ob  03/27/2016  CLINICAL DATA:  Palpable vaginal mass fell last night. Discomfort for 3 days EXAM: TRANSABDOMINAL AND TRANSVAGINAL ULTRASOUND OF PELVIS TECHNIQUE: Both transabdominal and transvaginal ultrasound examinations of the pelvis were performed. Transabdominal technique was performed for global imaging of the pelvis including uterus, ovaries, adnexal regions, and pelvic cul-de-sac. It was necessary to proceed with endovaginal exam following the transabdominal exam to visualize the endometrium and ovaries. COMPARISON:  None FINDINGS: Uterus Measurements: 8.7 x 4.1 x 5.3 cm. Multiple nabothian cysts. No fibroids or other mass visualized. Endometrium Thickness: 9 mm. Intrauterine device in satisfactory position. No focal abnormality visualized. Right ovary Not visualized. Left ovary Measurements: 3.8 x 3.1 x 2.6 cm. Normal appearance/no adnexal mass. Other findings No pelvic free fluid.  No soft tissue mass identified. IMPRESSION: 1. Intrauterine device in satisfactory position. 2. Otherwise normal pelvic ultrasound. No soft tissue gas identified. Recommend correlation with direct visualization. Electronically Signed   By: Kathreen Devoid    On: 03/27/2016 08:44   US Pelvis Complete  03/27/2016  CLINICAL DATA:  Palpable vaginal mass fell last night. Discomfort for 3 days EXAM: TRANSABDOMINAL AND TRANSVAGINAL ULTRASOUND OF PELVIS TECHNIQUE: Both transabdominal and transvaginal ultrasound examinations of the pelvis were performed. Transabdominal technique was performed for global imaging of the pelvis including uterus, ovaries, adnexal regions, and pelvic cul-de-sac. It was necessary to proceed with endovaginal exam following the transabdominal exam to visualize the endometrium and ovaries. COMPARISON:  None FINDINGS: Uterus Measurements: 8.7 x 4.1 x 5.3 cm. Multiple nabothian cysts. No fibroids or other mass visualized. Endometrium Thickness: 9 mm. Intrauterine device in satisfactory position. No focal abnormality visualized. Right ovary Not visualized. Left ovary Measurements: 3.8 x 3.1 x 2.6 cm. Normal appearance/no adnexal mass. Other findings No pelvic free fluid.  No soft tissue mass identified. IMPRESSION: 1. Intrauterine device in satisfactory position. 2. Otherwise normal pelvic ultrasound. No soft tissue gas identified. Recommend correlation with direct visualization. Electronically Signed   By: Elbert Ewings  Patel   On: 03/27/2016 08:44   I have personally reviewed and evaluated these images and lab results as part of my medical decision-making.   EKG Interpretation None      MDM   Final diagnoses:  Pelvic pain in female  Bacterial vaginosis    [place], UA, Mass is not discrete - no ttp over mass - no bartholin's VS reviewed - has htn, Mother had bladder CA.   Labs and w/u mostly neg - Flagyl for BV.  Pt expressed undersatnding - given copy of results.  Noemi Chapel, MD 03/27/16 1050

## 2016-03-27 NOTE — Discharge Instructions (Signed)

## 2016-03-29 ENCOUNTER — Encounter: Payer: Self-pay | Admitting: Family Medicine

## 2016-03-29 ENCOUNTER — Ambulatory Visit (INDEPENDENT_AMBULATORY_CARE_PROVIDER_SITE_OTHER): Payer: Managed Care, Other (non HMO) | Admitting: Family Medicine

## 2016-03-29 VITALS — BP 135/106 | HR 102 | Ht 66.0 in | Wt 233.0 lb

## 2016-03-29 DIAGNOSIS — N751 Abscess of Bartholin's gland: Secondary | ICD-10-CM

## 2016-03-29 MED ORDER — SULFAMETHOXAZOLE-TRIMETHOPRIM 800-160 MG PO TABS: 1.0000 | ORAL_TABLET | Freq: Two times a day (BID) | ORAL | Status: DC

## 2016-03-29 MED FILL — SULFAMETHOXAZOLE-TMP DS TAB: 800-160 | 7 days supply | Qty: 14 | Fill #0

## 2016-03-29 NOTE — Patient Instructions (Signed)
Bartholin Cyst or Abscess A Bartholin cyst is a fluid-filled sac that forms on a Bartholin gland. Bartholin glands are small glands that are located within the folds of skin (labia) along the sides of the lower opening of the vagina. These glands produce a fluid to moisten the outside of the vagina during sexual intercourse. A Bartholin cyst causes a bulge on the side of the vagina. A cyst that is not large or infected may not cause symptoms or problems. However, if the fluid within the cyst becomes infected, the cyst can turn into an abscess. An abscess may cause discomfort or pain. CAUSES A Bartholin cyst may develop when the duct of the gland becomes blocked. In many cases, the cause of this is not known. Various kinds of bacteria can cause the cyst to become infected and develop into an abscess. RISK FACTORS You may be at an increased risk of developing a Bartholin cyst or abscess if:  You are a woman of reproductive age.  You have a history of previous Bartholin cysts or abscesses.  You have diabetes.  You have a sexually transmitted disease (STD). SIGNS AND SYMPTOMS The severity of symptoms varies depending on the size of the cyst and whether it is infected. Symptoms may include:  A bulge or swelling near the lower opening of your vagina.  Discomfort or pain.  Redness.  Pain during sexual intercourse.  Pain when walking.  Fluid draining from the area. DIAGNOSIS Your health care provider may make a diagnosis based on your symptoms and a physical exam. He or she will look for swelling in your vaginal area. Blood tests may be done to check for infections. A sample of fluid from the cyst or abscess may also be taken to be tested in a lab. TREATMENT Small cysts that are not infected may not require any treatment. These often go away on their own. Yourhealth care provider will recommend hot baths and the use of warm compresses. These may also be part of the treatment for an abscess.  Treatment options for a large cyst or abscess may include:   Antibiotic medicine.  A surgical procedure to drain the abscess. One of the following procedures may be done:  Incision and drainage. An incision is made in the cyst or abscess so that the fluid drains out. A catheter may be placed inside the cyst so that it does not close and fill up with fluid again. The catheter will be removed after you have a follow-up visit with a specialist (gynecologist).  Marsupialization. The cyst or abscess is opened and kept open by stitching the edges of the skin to the walls of the cyst or abscess. This allows it to continue to drain and not fill up with fluid again. If you have cysts or abscesses that keep returning and have required incision and drainage multiple times, your health care provider may talk to you about surgery to remove the Bartholin gland. HOME CARE INSTRUCTIONS  Take medicines only as directed by your health care provider.  If you were prescribed an antibiotic medicine, finish it all even if you start to feel better.  Apply warm, wet compresses to the area or take warm, shallow baths that cover your pelvic region (sitz baths) several times a day or as directed by your health care provider.  Do not squeeze the cyst or apply heavy pressure to it.  Do not have sexual intercourse until the cyst has gone away.  If your cyst or abscess was   opened, a small piece of gauze or a drain may have been placed in the area to allow drainage. Do not remove the gauze or the drain until directed by your health care provider.  Wear feminine pads--not tampons--as needed for any drainage or bleeding.  Keep all follow-up visits as directed by your health care provider. This is important. PREVENTION Take these steps to help prevent a Bartholin cyst from returning:  Practice good hygiene.   Clean your vaginal area with mild soap and a soft cloth when you bathe.  Practice safe sex to prevent  STDs. SEEK MEDICAL CARE IF:  You have increased pain, swelling, or redness in the area of the cyst.  Puslike drainage is coming from the cyst.  You have a fever.   This information is not intended to replace advice given to you by your health care provider. Make sure you discuss any questions you have with your health care provider.   Document Released: 11/06/2005 Document Revised: 11/27/2014 Document Reviewed: 06/22/2014 Elsevier Interactive Patient Education [employer].

## 2016-03-29 NOTE — Progress Notes (Signed)
   Subjective:    Patient ID: Shawna Harper, female    DOB: 07/13/1976, 40 y.o.   MRN: [id number]  HPI Patient seen for vaginal pain that started 2 days ago.  Was seen at [facility] ED, diagnosed with BV and given prescription of flagyl.  Pain has increased.  Pain worse with wiping, walking, movement.  Better with rest. Does have IUD - placed approximately 4.5-5 years ago.    I have reviewed the patients past medical, family, and social history.  I have reviewed the patient's medication list and allergies.   Review of Systems  Constitutional: Negative for fever, chills and fatigue.  Gastrointestinal: Negative for nausea, vomiting, abdominal pain, diarrhea and constipation.       Objective:   Physical Exam  Constitutional: She is oriented to person, place, and time. She appears well-developed and well-nourished.  HENT:  Head: Normocephalic and atraumatic.  Right Ear: External ear normal.  Left Ear: External ear normal.  Cardiovascular: Normal rate, regular rhythm and normal heart sounds.  Exam reveals no gallop and no friction rub.   No murmur heard. Pulmonary/Chest: Effort normal. No respiratory distress. She has no wheezes. She has no rales. She exhibits no tenderness.  Abdominal: Soft. Bowel sounds are normal. She exhibits no distension and no mass. There is no tenderness. There is no rebound and no guarding.  Genitourinary:     Neurological: She is alert and oriented to person, place, and time.  Skin: Skin is warm and dry.  Psychiatric: She has a normal mood and affect. Her behavior is normal. Judgment and thought content normal.    .      Assessment & Plan:  1. Bartholin's gland abscess See below   Bartholin Cyst I&D and Word Catheter Placement Enlarged abscess palpated in front of the hymenal ring around 5 o' clock.  Written informed consent was obtained.  Discussed complications and possible outcomes of procedure including recurrence of cyst, scarring leading to infecton,  bleeding, dyspareunia, distortion of anatomy.  Patient was examined in the dorsal lithotomy position and mass was identified.  The area was prepped with Iodine and draped in a sterile manner. 1% Lidocaine with epinephrine (3 ml) was then used to infiltrate area on top of the cyst, behind the hymenal ring.  A 7 mm incision was made using a sterile scapel. Upon palpation of the mass, a moderate amount of bloody purulent drainage was expressed through the incision. A Word catheter placement was attempted, but would not stay in place.  The abscess was packed with 5cm of 1/4 inch ribbon and the end of the packing was tucked into the vagina.  Patient tolerated the procedure well, reported feeling " a lot better." - Bactrim DS bid x 7 days for treatment - Recommended Sitz baths bid and Motrin prn pain.   She was told to call to be examined if she experiences increasing swelling, pain, vaginal discharge, or fever.  - She was instructed to wear a peripad to absorb discharge, and to maintain pelvic rest while the Word catheter is in place.  - She will need an appointment in [facility] in 4 days for recheck and replacement of packing.

## 2016-04-03 ENCOUNTER — Encounter: Payer: Self-pay | Admitting: Obstetrics & Gynecology

## 2016-04-03 ENCOUNTER — Ambulatory Visit (INDEPENDENT_AMBULATORY_CARE_PROVIDER_SITE_OTHER): Payer: Managed Care, Other (non HMO) | Admitting: Obstetrics & Gynecology

## 2016-04-03 VITALS — BP 129/96 | HR 75 | Resp 16 | Ht 66.0 in | Wt 231.0 lb

## 2016-04-03 DIAGNOSIS — N751 Abscess of Bartholin's gland: Secondary | ICD-10-CM

## 2016-04-03 MED ORDER — FLUCONAZOLE 150 MG PO TABS: 150.0000 mg | ORAL_TABLET | Freq: Once | ORAL | Status: DC

## 2016-04-03 MED ORDER — SULFAMETHOXAZOLE-TRIMETHOPRIM 800-160 MG PO TABS
1.0000 | ORAL_TABLET | Freq: Two times a day (BID) | ORAL | Status: DC
Start: 2016-04-03 — End: 2018-11-11

## 2016-04-03 MED FILL — FLUCONAZOLE 150 MG TABLET: 150 | 1 days supply | Qty: 1 | Fill #0

## 2016-04-03 MED FILL — SULFAMETHOXAZOLE-TMP DS TAB: 800-160 | 3 days supply | Qty: 6 | Fill #0

## 2016-04-03 NOTE — Progress Notes (Signed)
CLINIC ENCOUNTER NOTE  History:  40 y.o. G1P1001 here today for followup after I&D of Bartholin's gland abscess on 03/29/16. Word catheter could not be placed at that time; dressing placed.  She is here for reevaluation and replacement of packing. She reports decreased pain but still feels a bump down there.  She denies any abnormal vaginal discharge, bleeding, pelvic pain or other concerns.   Past Medical History  Diagnosis Date  . Hypertension   . GERD (gastroesophageal reflux disease)     Past Surgical History  Procedure Laterality Date  . Tonsillectomy    . Upper gastrointestinal endoscopy  2005  . Colonoscopy      The following portions of the patient's history were reviewed and updated as appropriate: allergies, current medications, past family history, past medical history, past social history, past surgical history and problem list.    Review of Systems:  Pertinent items noted in HPI and remainder of comprehensive ROS otherwise negative.  Objective:  Physical Exam BP 129/96 mmHg  Pulse 75  Resp 16  Ht 5\' 6"  (1.676 m)  Wt 231 lb (104.781 kg)  BMI 37.30 kg/m2 CONSTITUTIONAL: Well-developed, well-nourished female in no acute distress.  HENT:  Normocephalic, atraumatic. External right and left ear normal. Oropharynx is clear and moist EYES: Conjunctivae and EOM are normal. Pupils are equal, round, and reactive to light. No scleral icterus.  NECK: Normal range of motion, supple, no masses SKIN: Skin is warm and dry. No rash noted. Not diaphoretic. No erythema. No pallor. NEUROLOGIC: Alert and oriented to person, place, and time. Normal reflexes, muscle tone coordination. No cranial nerve deficit noted. PSYCHIATRIC: Normal mood and affect. Normal behavior. Normal judgment and thought content. CARDIOVASCULAR: Normal heart rate noted RESPIRATORY: Effort and breath sounds normal, no problems with respiration noted ABDOMEN: Soft, no distention noted.   MUSCULOSKELETAL:  Normal range of motion. No edema noted. PELVIC: See below Physical Exam  Genitourinary:      Bartholin Cyst I&D and Word Catheter Placement Enlarged abscess palpated in front of the hymenal ring around 5 o' clock.  Written informed consent was obtained.  Discussed complications and possible outcomes of procedure including recurrence of cyst, scarring leading to infection, bleeding, dyspareunia, distortion of anatomy.  Patient was examined in the dorsal lithotomy position and mass was identified.  The area was prepped with Iodine and draped in a sterile manner. 1% Lidocaine (3 ml) was then used to infiltrate area on top of the cyst around previous I&D site.  A 7 mm incision was made using a sterile scapel. Upon palpation of the mass, a moderate amount of bloody purulent drainage was expressed through the incision. A hemostat was used to break up loculations, which resulted in expression of more bloody purulent drainage.  The open cyst was then copiously irrigated, and a Word catheter was placed.  1 ml of sterile water was used to inflate the catheter balloon.  The end of the catheter was tucked into the vagina.  Patient tolerated the procedure well, reported feeling " a lot better." - Bactrim DS bid x 3 more days for treatment; Diflucan prescribed for rebound yeast infection - Recommended NSAIDs prn pain.   She was told to call to be examined if she experiences increasing swelling, pain, vaginal discharge, or fever.   - She was instructed to wear a peripad to absorb discharge, and to maintain pelvic rest while the Word catheter is in place.  -The catheter will be left in place for at least  two to four weeks to promote formation of an epithelialized tract for permanent drainage of glandular secretions.  - She will need an appointment in 4-6 weeks from now for removal of word catheter.   Assessment & Plan:  Bartholin's gland abscess Patient is s/p repeat I&D and Word catheter placement. Continue  antibiotics and analgesia prn. Return in about 1 week (around 04/10/2016) for Followup.     Verita Schneiders, MD, [place] Attending Obstetrician & Gynecologist, [place] for [facility]

## 2016-04-12 ENCOUNTER — Ambulatory Visit (INDEPENDENT_AMBULATORY_CARE_PROVIDER_SITE_OTHER): Payer: Managed Care, Other (non HMO) | Admitting: Family Medicine

## 2016-04-12 ENCOUNTER — Encounter: Payer: Self-pay | Admitting: Family Medicine

## 2016-04-12 VITALS — BP 119/94 | HR 95 | Ht 66.0 in | Wt 235.0 lb

## 2016-04-12 DIAGNOSIS — N751 Abscess of Bartholin's gland: Secondary | ICD-10-CM | POA: Diagnosis not present

## 2016-04-12 NOTE — Progress Notes (Signed)
   Subjective:    Patient ID: Shawna Harper, female    DOB: 07-17-1976, 40 y.o.   MRN: [id number]  HPI Patient seen for follow up of bartholin's gland abscess.  Pain limited.  Mild drainage.  No other complications.  Did take diflucan.   Review of Systems     Objective:   Physical Exam  Constitutional: She appears well-developed and well-nourished.  Genitourinary:    There is no rash, tenderness, lesion or injury on the right labia. There is no rash, tenderness, lesion or injury on the left labia.  Skin: Skin is warm and dry. No rash noted. No erythema. No pallor.  Psychiatric: She has a normal mood and affect. Her behavior is normal. Judgment and thought content normal.      Assessment & Plan:  1. Bartholin's gland abscess Continue with word catheter.  Will remove in 3 more weeks.  Pt to return with any further complication.

## 2016-05-03 ENCOUNTER — Ambulatory Visit: Payer: Managed Care, Other (non HMO) | Admitting: Obstetrics & Gynecology

## 2016-05-18 ENCOUNTER — Ambulatory Visit (INDEPENDENT_AMBULATORY_CARE_PROVIDER_SITE_OTHER): Payer: Managed Care, Other (non HMO) | Admitting: Obstetrics & Gynecology

## 2016-05-18 ENCOUNTER — Encounter: Payer: Self-pay | Admitting: Obstetrics & Gynecology

## 2016-05-18 VITALS — BP 139/78 | HR 68 | Ht 66.0 in | Wt 228.0 lb

## 2016-05-18 DIAGNOSIS — N751 Abscess of Bartholin's gland: Secondary | ICD-10-CM

## 2016-05-18 NOTE — Patient Instructions (Signed)
Bartholin Cyst or Abscess A Bartholin cyst is a fluid-filled sac that forms on a Bartholin gland. Bartholin glands are small glands that are located within the folds of skin (labia) along the sides of the lower opening of the vagina. These glands produce a fluid to moisten the outside of the vagina during sexual intercourse. A Bartholin cyst causes a bulge on the side of the vagina. A cyst that is not large or infected may not cause symptoms or problems. However, if the fluid within the cyst becomes infected, the cyst can turn into an abscess. An abscess may cause discomfort or pain. CAUSES A Bartholin cyst may develop when the duct of the gland becomes blocked. In many cases, the cause of this is not known. Various kinds of bacteria can cause the cyst to become infected and develop into an abscess. RISK FACTORS You may be at an increased risk of developing a Bartholin cyst or abscess if:  You are a woman of reproductive age.  You have a history of previous Bartholin cysts or abscesses.  You have diabetes.  You have a sexually transmitted disease (STD). SIGNS AND SYMPTOMS The severity of symptoms varies depending on the size of the cyst and whether it is infected. Symptoms may include:  A bulge or swelling near the lower opening of your vagina.  Discomfort or pain.  Redness.  Pain during sexual intercourse.  Pain when walking.  Fluid draining from the area. DIAGNOSIS Your health care provider may make a diagnosis based on your symptoms and a physical exam. He or she will look for swelling in your vaginal area. Blood tests may be done to check for infections. A sample of fluid from the cyst or abscess may also be taken to be tested in a lab. TREATMENT Small cysts that are not infected may not require any treatment. These often go away on their own. Yourhealth care provider will recommend hot baths and the use of warm compresses. These may also be part of the treatment for an abscess.  Treatment options for a large cyst or abscess may include:   Antibiotic medicine.  A surgical procedure to drain the abscess. One of the following procedures may be done:  Incision and drainage. An incision is made in the cyst or abscess so that the fluid drains out. A catheter may be placed inside the cyst so that it does not close and fill up with fluid again. The catheter will be removed after you have a follow-up visit with a specialist (gynecologist).  Marsupialization. The cyst or abscess is opened and kept open by stitching the edges of the skin to the walls of the cyst or abscess. This allows it to continue to drain and not fill up with fluid again. If you have cysts or abscesses that keep returning and have required incision and drainage multiple times, your health care provider may talk to you about surgery to remove the Bartholin gland. HOME CARE INSTRUCTIONS  Take medicines only as directed by your health care provider.  If you were prescribed an antibiotic medicine, finish it all even if you start to feel better.  Apply warm, wet compresses to the area or take warm, shallow baths that cover your pelvic region (sitz baths) several times a day or as directed by your health care provider.  Do not squeeze the cyst or apply heavy pressure to it.  Do not have sexual intercourse until the cyst has gone away.  If your cyst or abscess was  opened, a small piece of gauze or a drain may have been placed in the area to allow drainage. Do not remove the gauze or the drain until directed by your health care provider.  Wear feminine pads--not tampons--as needed for any drainage or bleeding.  Keep all follow-up visits as directed by your health care provider. This is important. PREVENTION Take these steps to help prevent a Bartholin cyst from returning:  Practice good hygiene.   Clean your vaginal area with mild soap and a soft cloth when you bathe.  Practice safe sex to prevent  STDs. SEEK MEDICAL CARE IF:  You have increased pain, swelling, or redness in the area of the cyst.  Puslike drainage is coming from the cyst.  You have a fever.   This information is not intended to replace advice given to you by your health care provider. Make sure you discuss any questions you have with your health care provider.   Document Released: 11/06/2005 Document Revised: 11/27/2014 Document Reviewed: 06/22/2014 Elsevier Interactive Patient Education [employer].

## 2016-05-18 NOTE — Progress Notes (Signed)
Patient ID: Shawna Harper, female   DOB: April 26, 1976, 40 y.o.   MRN: [id number] History:  40 y.o. G1P1001 here today for f/u of Bartholins gland abscess.  Pt had WORD catheter places 04/03/2016.  She reports that the catheter fell out in about 3 weeks and she is now sx free.  She reports not being sexually active for 3 years.    The following portions of the patient's history were reviewed and updated as appropriate: allergies, current medications, past family history, past medical history, past social history, past surgical history and problem list.  Review of Systems:  Pertinent items are noted in HPI.  Objective:  Physical Exam Blood pressure 139/78, pulse 68, height 5\' 6"  (1.676 m), weight 228 lb (103.42 kg). Gen: NAD Pelvic: Normal appearing external genitalia; normal appearing vaginal mucosa and cervix.  Normal discharge.  Area of Bartholin's gland on the left is completely healed with no palpable masses.  Labs and Imaging No results found.  Assessment & Plan:  Bartholin's gland abscess- resolved.  F/u prn  Marvel Sapp L. Harraway-Smith, M.D., Cherlynn June

## 2016-05-21 IMAGING — US US TRANSVAGINAL NON-OB
1 series · 14 of 25 positions shown · non-contrast
Comparison: None

CLINICAL DATA: Palpable vaginal mass fell last night. Discomfort
for 3 days

EXAM:
TRANSABDOMINAL AND TRANSVAGINAL ULTRASOUND OF PELVIS
TECHNIQUE: Both transabdominal and transvaginal ultrasound examinations of the
pelvis were performed. Transabdominal technique was performed for
global imaging of the pelvis including uterus, ovaries, adnexal
regions, and pelvic cul-de-sac. It was necessary to proceed with
endovaginal exam following the transabdominal exam to visualize the
endometrium and ovaries.

[Series 1: us transvaginal non-ob · 0.24mm/px · 14 of 38 slices shown]
[im 1/38]
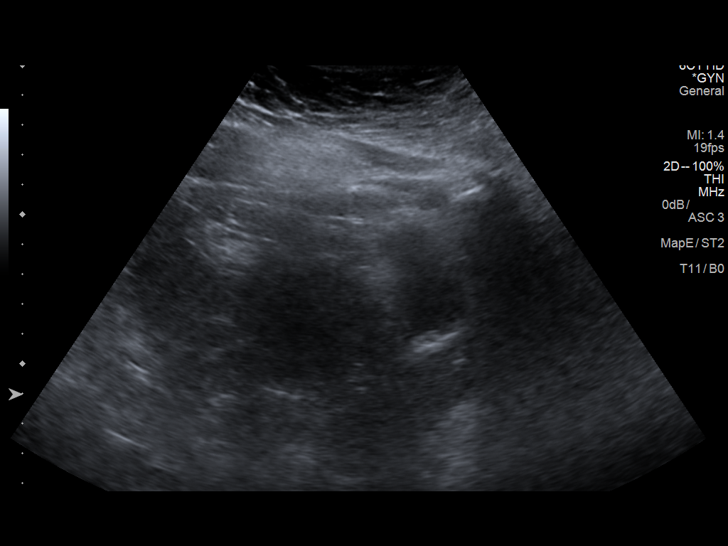
[im 4/38]
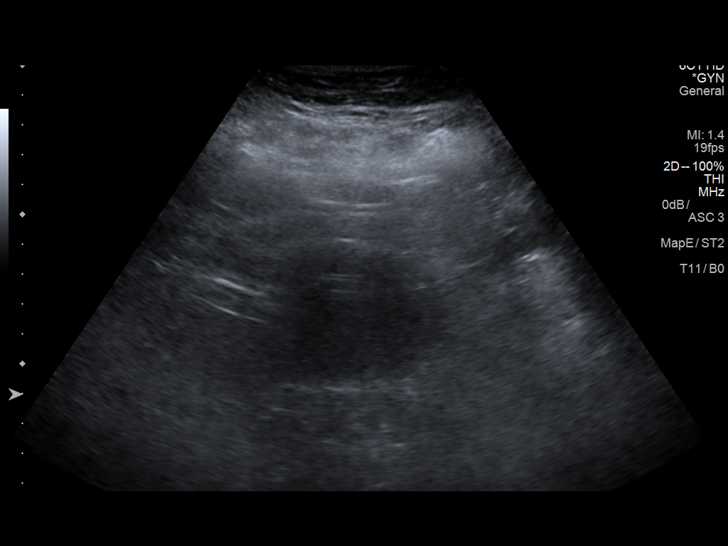
[im 7/38]
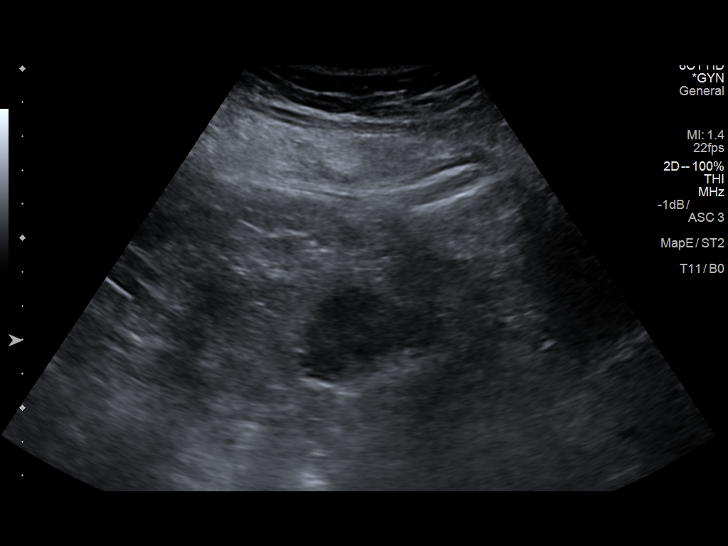
[im 10/38]
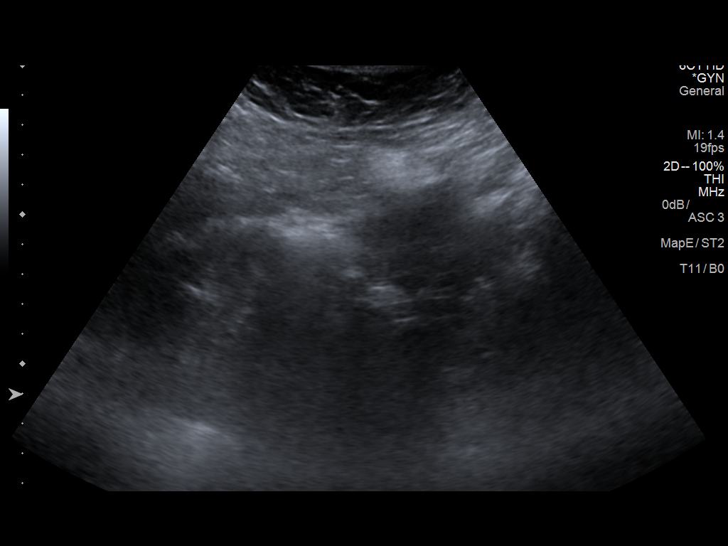
[im 13/38]
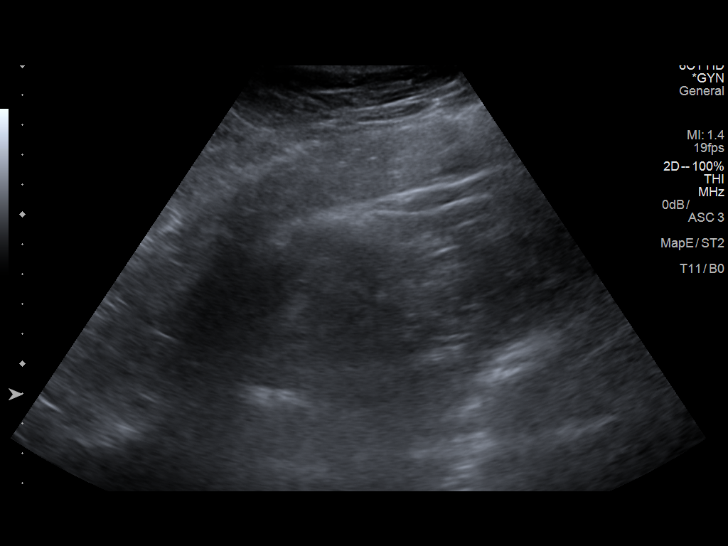
[im 14/38]
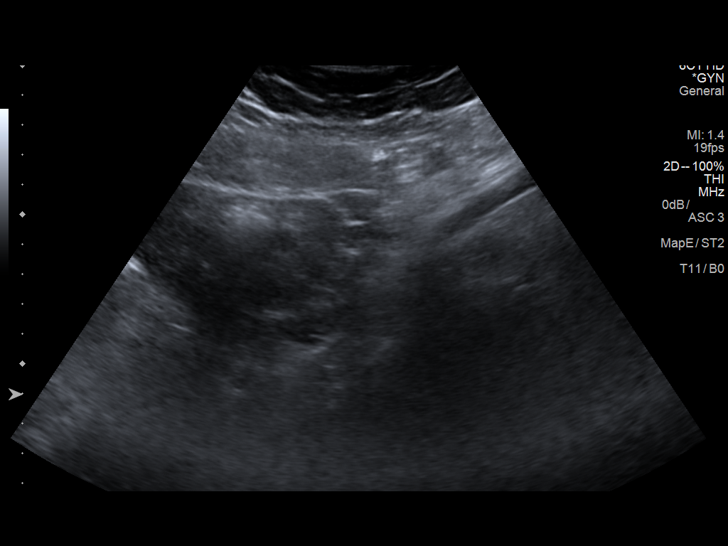
[im 17/38]
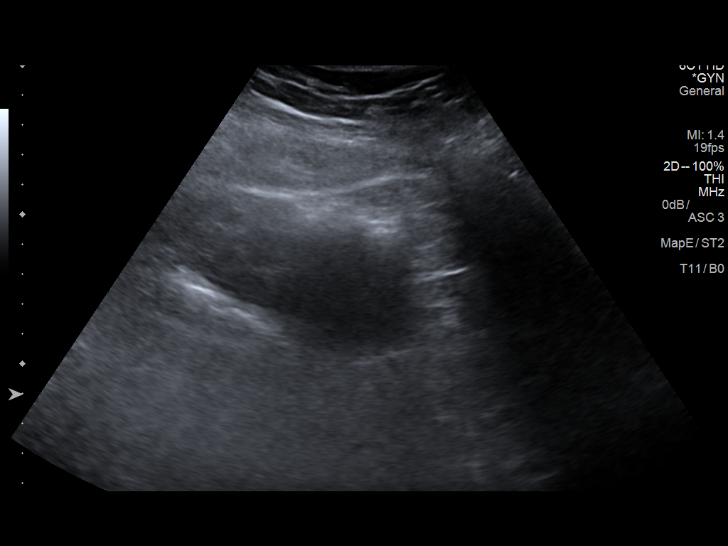
[im 21/38]
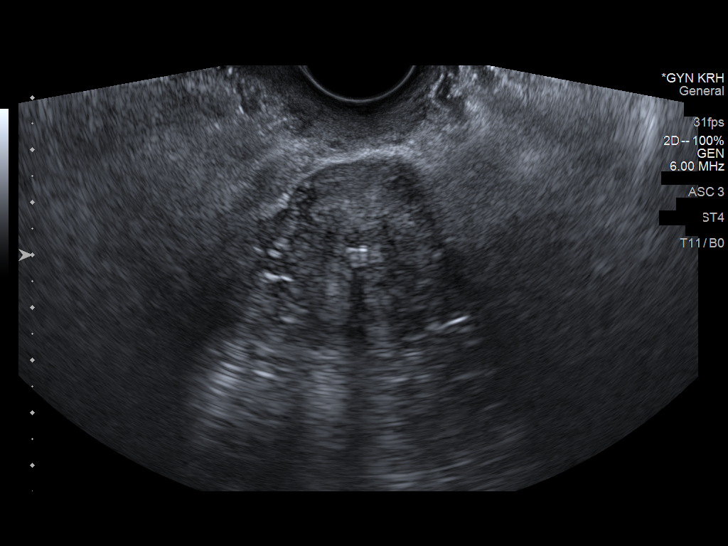
[im 24/38]
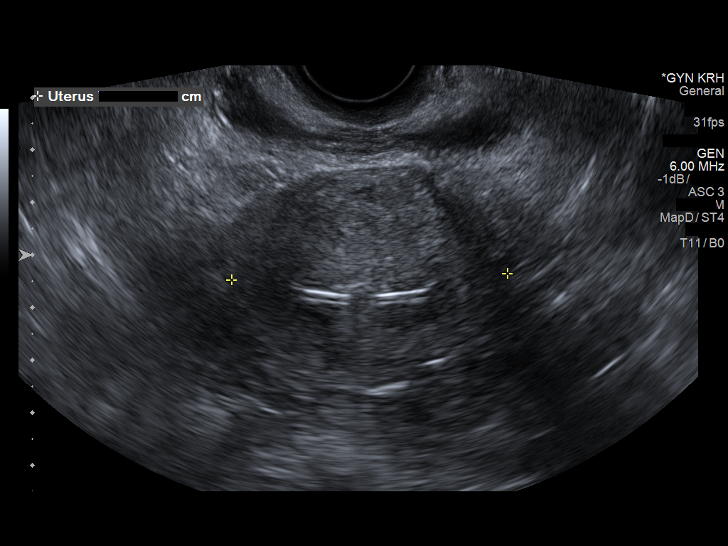
[im 25/38]
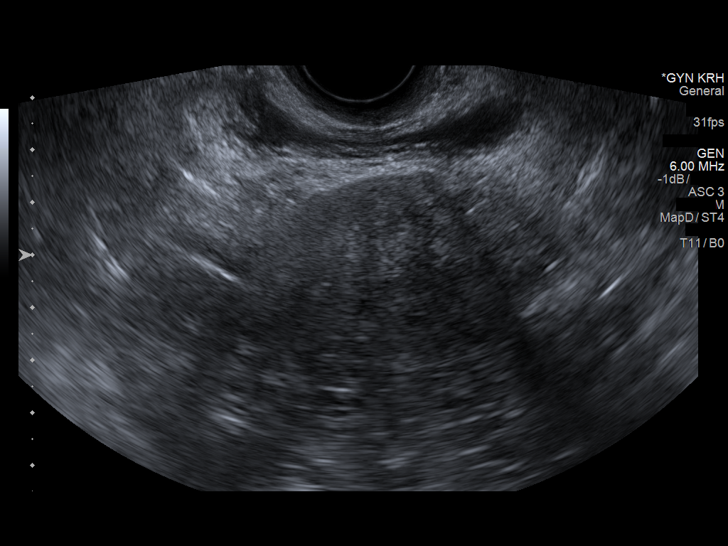
[im 28/38]
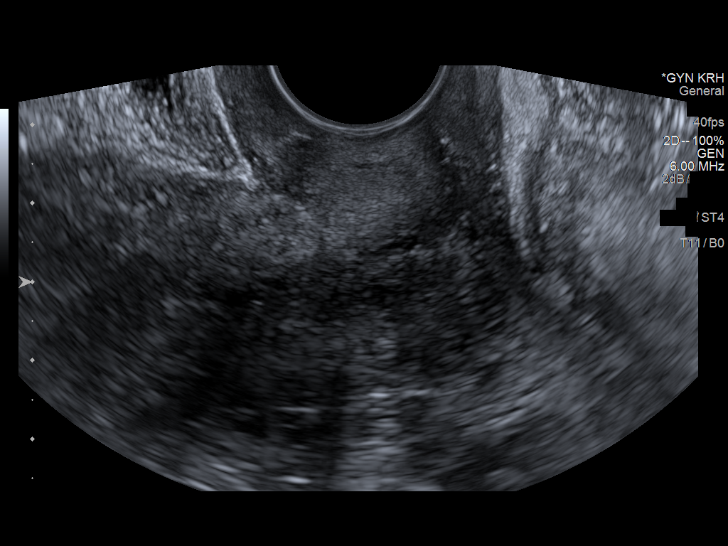
[im 31/38]
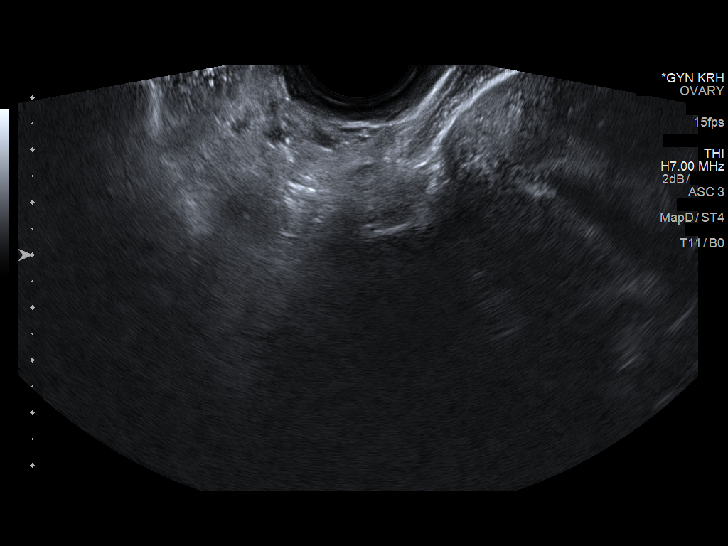
[im 34/38]
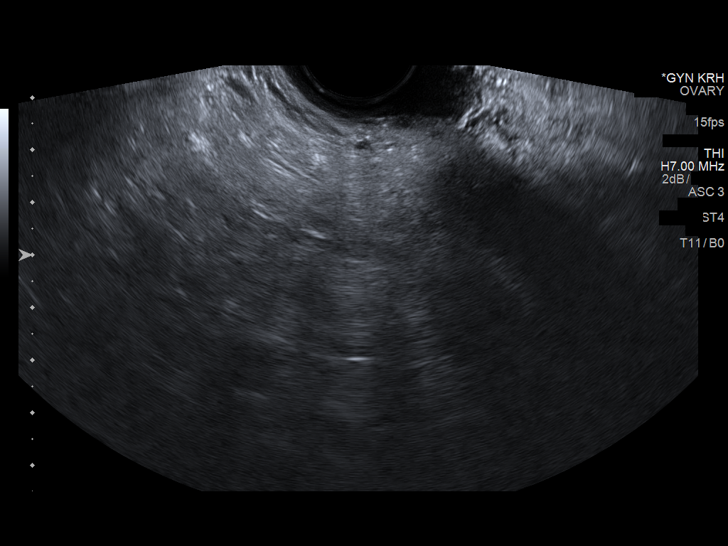
[im 38/38]
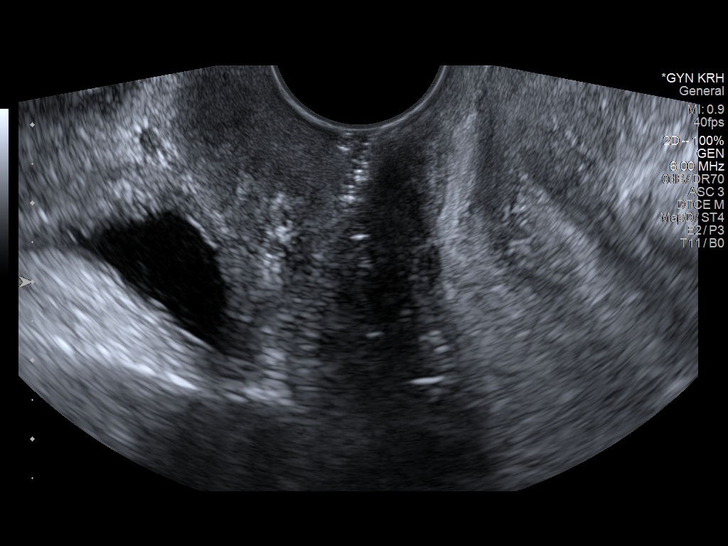

[14 of 25 positions shown; findings below may reference images not displayed]

FINDINGS: Uterus

Measurements: 8.7 x 4.1 x 5.3 cm. Multiple nabothian cysts. No
fibroids or other mass visualized.

Endometrium

Thickness: 9 mm. Intrauterine device in satisfactory position. No
focal abnormality visualized.

Right ovary

Not visualized.

Left ovary

Measurements: 3.8 x 3.1 x 2.6 cm. Normal appearance/no adnexal mass.

Other findings

No pelvic free fluid.  No soft tissue mass identified.
IMPRESSION: 1. Intrauterine device in satisfactory position.
2. Otherwise normal pelvic ultrasound. No soft tissue gas
identified. Recommend correlation with direct visualization.

## 2016-12-18 ENCOUNTER — Emergency Department (HOSPITAL_BASED_OUTPATIENT_CLINIC_OR_DEPARTMENT_OTHER)
Admission: EM | Admit: 2016-12-18 | Discharge: 2016-12-18 | Disposition: A | Discharge status: Home / Self Care | Payer: Managed Care, Other (non HMO) | Attending: Emergency Medicine | Admitting: Emergency Medicine

## 2016-12-18 ENCOUNTER — Encounter (HOSPITAL_BASED_OUTPATIENT_CLINIC_OR_DEPARTMENT_OTHER): Payer: Self-pay | Admitting: *Deleted

## 2016-12-18 ENCOUNTER — Emergency Department (HOSPITAL_BASED_OUTPATIENT_CLINIC_OR_DEPARTMENT_OTHER): Payer: Managed Care, Other (non HMO)

## 2016-12-18 DIAGNOSIS — Z79899 Other long term (current) drug therapy: Secondary | ICD-10-CM | POA: Insufficient documentation

## 2016-12-18 DIAGNOSIS — J40 Bronchitis, not specified as acute or chronic: Secondary | ICD-10-CM | POA: Diagnosis not present

## 2016-12-18 DIAGNOSIS — R0602 Shortness of breath: Secondary | ICD-10-CM | POA: Diagnosis present

## 2016-12-18 DIAGNOSIS — F17210 Nicotine dependence, cigarettes, uncomplicated: Secondary | ICD-10-CM | POA: Insufficient documentation

## 2016-12-18 DIAGNOSIS — I10 Essential (primary) hypertension: Secondary | ICD-10-CM | POA: Insufficient documentation

## 2016-12-18 MED ORDER — HYDROCODONE-HOMATROPINE 5-1.5 MG/5ML PO SYRP: 5.0000 mL | ORAL_SOLUTION | Freq: Four times a day (QID) | ORAL | 0 refills | Status: DC | PRN

## 2016-12-18 MED ORDER — AEROCHAMBER PLUS W/MASK MISC: 2 refills | Status: DC

## 2016-12-18 MED ORDER — ALBUTEROL SULFATE HFA 108 (90 BASE) MCG/ACT IN AERS: 1.0000 | INHALATION_SPRAY | Freq: Four times a day (QID) | RESPIRATORY_TRACT | 0 refills | Status: DC | PRN

## 2016-12-18 MED ORDER — IPRATROPIUM-ALBUTEROL 0.5-2.5 (3) MG/3ML IN SOLN
3.0000 mL | Freq: Four times a day (QID) | RESPIRATORY_TRACT | Status: DC
  Administered 2016-12-18: 3 mL via RESPIRATORY_TRACT
  Filled 2016-12-18: qty 3

## 2016-12-18 MED FILL — PROAIR HFA 90 MCG INHALER: 108 (90 BAS | 25 days supply | Qty: 9 | Fill #0

## 2016-12-18 MED FILL — MICROCHAMBER: 30 days supply | Qty: 1 | Fill #0

## 2016-12-18 MED FILL — HYDROCODONE-HOMATROPINE SYR: 5-1.5 | 6 days supply | Qty: 120 | Fill #0

## 2016-12-18 NOTE — ED Triage Notes (Signed)
Cough for a week. She can't take a deep breath.

## 2016-12-18 NOTE — ED Provider Notes (Signed)
[facility] Provider Note   CSN: [id number] Arrival date & time: 12/18/16  1202     History   Chief Complaint Chief Complaint  Patient presents with  . Cough  . Shortness of Breath    HPI Shawna Harper is a 41 y.o. female.  HPI Patient poor she has had a dry hacking cough for a week. He reports is worse at night when she tries to lie down. She has not had a fever. No significant nasal congestion or sore throat. No vomiting or diarrhea. Patient does smoke but she reports she's been unable smoke this week due to her coughing. He reports her sister has similar symptoms with bronchitis but she also has upper respiratory symptoms as well. Patient denies any lower extremity pain or swelling. Past Medical History:  Diagnosis Date  . GERD (gastroesophageal reflux disease)   . Hypertension     There are no active problems to display for this patient.   Past Surgical History:  Procedure Laterality Date  . COLONOSCOPY    . TONSILLECTOMY    . UPPER GASTROINTESTINAL ENDOSCOPY  2005    OB History    Gravida Para Term Preterm AB Living   1 1 1     1    SAB TAB Ectopic Multiple Live Births                   Home Medications    Prior to Admission medications   Medication Sig Start Date End Date Taking? Authorizing Provider  albuterol (PROVENTIL HFA;VENTOLIN HFA) 108 (90 Base) MCG/ACT inhaler Inhale 1-2 puffs into the lungs every 6 (six) hours as needed for wheezing or shortness of breath. 12/18/16   Charlesetta Shanks, MD  fluconazole (DIFLUCAN) 150 MG tablet Take 1 tablet (150 mg total) by mouth once. Can take additional dose three days later if symptoms persist Patient not taking: Reported on 04/12/2016 04/03/16   Osborne Oman, MD  HYDROcodone-homatropine (HYCODAN) 5-1.5 MG/5ML syrup Take 5 mLs by mouth every 6 (six) hours as needed for cough. 12/18/16   Charlesetta Shanks, MD  Spacer/Aero-Holding Chambers (AEROCHAMBER PLUS WITH MASK) inhaler Use as instructed 12/18/16    Charlesetta Shanks, MD  sulfamethoxazole-trimethoprim (BACTRIM DS,SEPTRA DS) 800-160 MG tablet Take 1 tablet by mouth 2 (two) times daily. 04/03/16   Osborne Oman, MD    Family History Family History  Problem Relation Age of Onset  . Cancer Mother 65    Non Hodgkins Lymph  . Diabetes Father   . Diabetes Paternal Grandmother   . Colon cancer Neg Hx   . Stomach cancer Neg Hx   . Stroke Neg Hx     Social History Social History  Substance Use Topics  . Smoking status: Current Every Day Smoker    Packs/day: 1.00    Types: Cigarettes    Start date: 11/20/1990  . Smokeless tobacco: Never Used  . Alcohol use 0.6 oz/week    1 Standard drinks or equivalent per week     Comment: mixed drinks, one a week     Allergies   Vicodin [hydrocodone-acetaminophen]   Review of Systems Review of Systems 10 Systems reviewed and are negative for acute change except as noted in the HPI.   Physical Exam Updated Vital Signs BP (!) 152/109 (BP Location: Right Arm)   Pulse 78   Temp 98.4 F (36.9 C) (Oral)   Resp 20   Ht 5\' 6"  (1.676 m)   Wt 223 lb (101.2 kg)  SpO2 97%   BMI 35.99 kg/m   Physical Exam  Constitutional: She is oriented to person, place, and time. She appears well-developed and well-nourished. No distress.  HENT:  Head: Normocephalic and atraumatic.  Nose: Nose normal.  Mouth/Throat: Oropharynx is clear and moist.  Eyes: Conjunctivae and EOM are normal.  Neck: Neck supple.  Cardiovascular: Normal rate and regular rhythm.   No murmur heard. Pulmonary/Chest: Effort normal. No respiratory distress.  Occasional expiratory wheeze  Abdominal: Soft. There is no tenderness.  Musculoskeletal: She exhibits no edema, tenderness or deformity.  Neurological: She is alert and oriented to person, place, and time. She exhibits normal muscle tone. Coordination normal.  Skin: Skin is warm and dry.  Psychiatric: She has a normal mood and affect.  Nursing note and vitals  reviewed.    ED Treatments / Results  Labs (all labs ordered are listed, but only abnormal results are displayed) Labs Reviewed - No data to display  EKG  EKG Interpretation  Date/Time:  Monday December 18 2016 12:11:37 EST Ventricular Rate:  87 PR Interval:  160 QRS Duration: 78 QT Interval:  376 QTC Calculation: 452 R Axis:   80 Text Interpretation:  Normal sinus rhythm with sinus arrhythmia Normal ECG Confirmed by Johnney Killian, MD, Jeannie Done [phone]) on 12/18/2016 4:03:17 PM       Radiology Dg Chest 2 View  Result Date: 12/18/2016 CLINICAL DATA:  Chest pain, cough. EXAM: CHEST  2 VIEW COMPARISON:  None. FINDINGS: The heart size and mediastinal contours are within normal limits. Both lungs are clear. The visualized skeletal structures are unremarkable. IMPRESSION: No active cardiopulmonary disease. Electronically Signed   By: Marijo Conception, M.D.   On: 12/18/2016 15:17    Procedures Procedures (including critical care time)  Medications Ordered in ED Medications  ipratropium-albuterol (DUONEB) 0.5-2.5 (3) MG/3ML nebulizer solution 3 mL (3 mLs Nebulization Given 12/18/16 1512)     Initial Impression / Assessment and Plan / ED Course  I have reviewed the triage vital signs and the nursing notes.  Pertinent labs & imaging results that were available during my care of the patient were reviewed by me and considered in my medical decision making (see chart for details).      Final Clinical Impressions(s) / ED Diagnoses   Final diagnoses:  Bronchitis  Patient will cough consistent with bronchitis. She is otherwise well. Cough will be treated symptomatically with Hycodan and albuterol inhaler. Patient reports she is not currently smoking due to her coughing. She is encouraged to pursue full smoking cessation.  New Prescriptions New Prescriptions   ALBUTEROL (PROVENTIL HFA;VENTOLIN HFA) 108 (90 BASE) MCG/ACT INHALER    Inhale 1-2 puffs into the lungs every 6 (six) hours as needed  for wheezing or shortness of breath.   HYDROCODONE-HOMATROPINE (HYCODAN) 5-1.5 MG/5ML SYRUP    Take 5 mLs by mouth every 6 (six) hours as needed for cough.   SPACER/AERO-HOLDING CHAMBERS (AEROCHAMBER PLUS WITH MASK) INHALER    Use as instructed     Charlesetta Shanks, MD 12/18/16 1605

## 2017-10-26 IMAGING — CR DG CHEST 2V
2 series · 2 of 2 positions shown · non-contrast
Comparison: None.

CLINICAL DATA: Chest pain, cough.

EXAM:
CHEST  2 VIEW

[w chest pa]
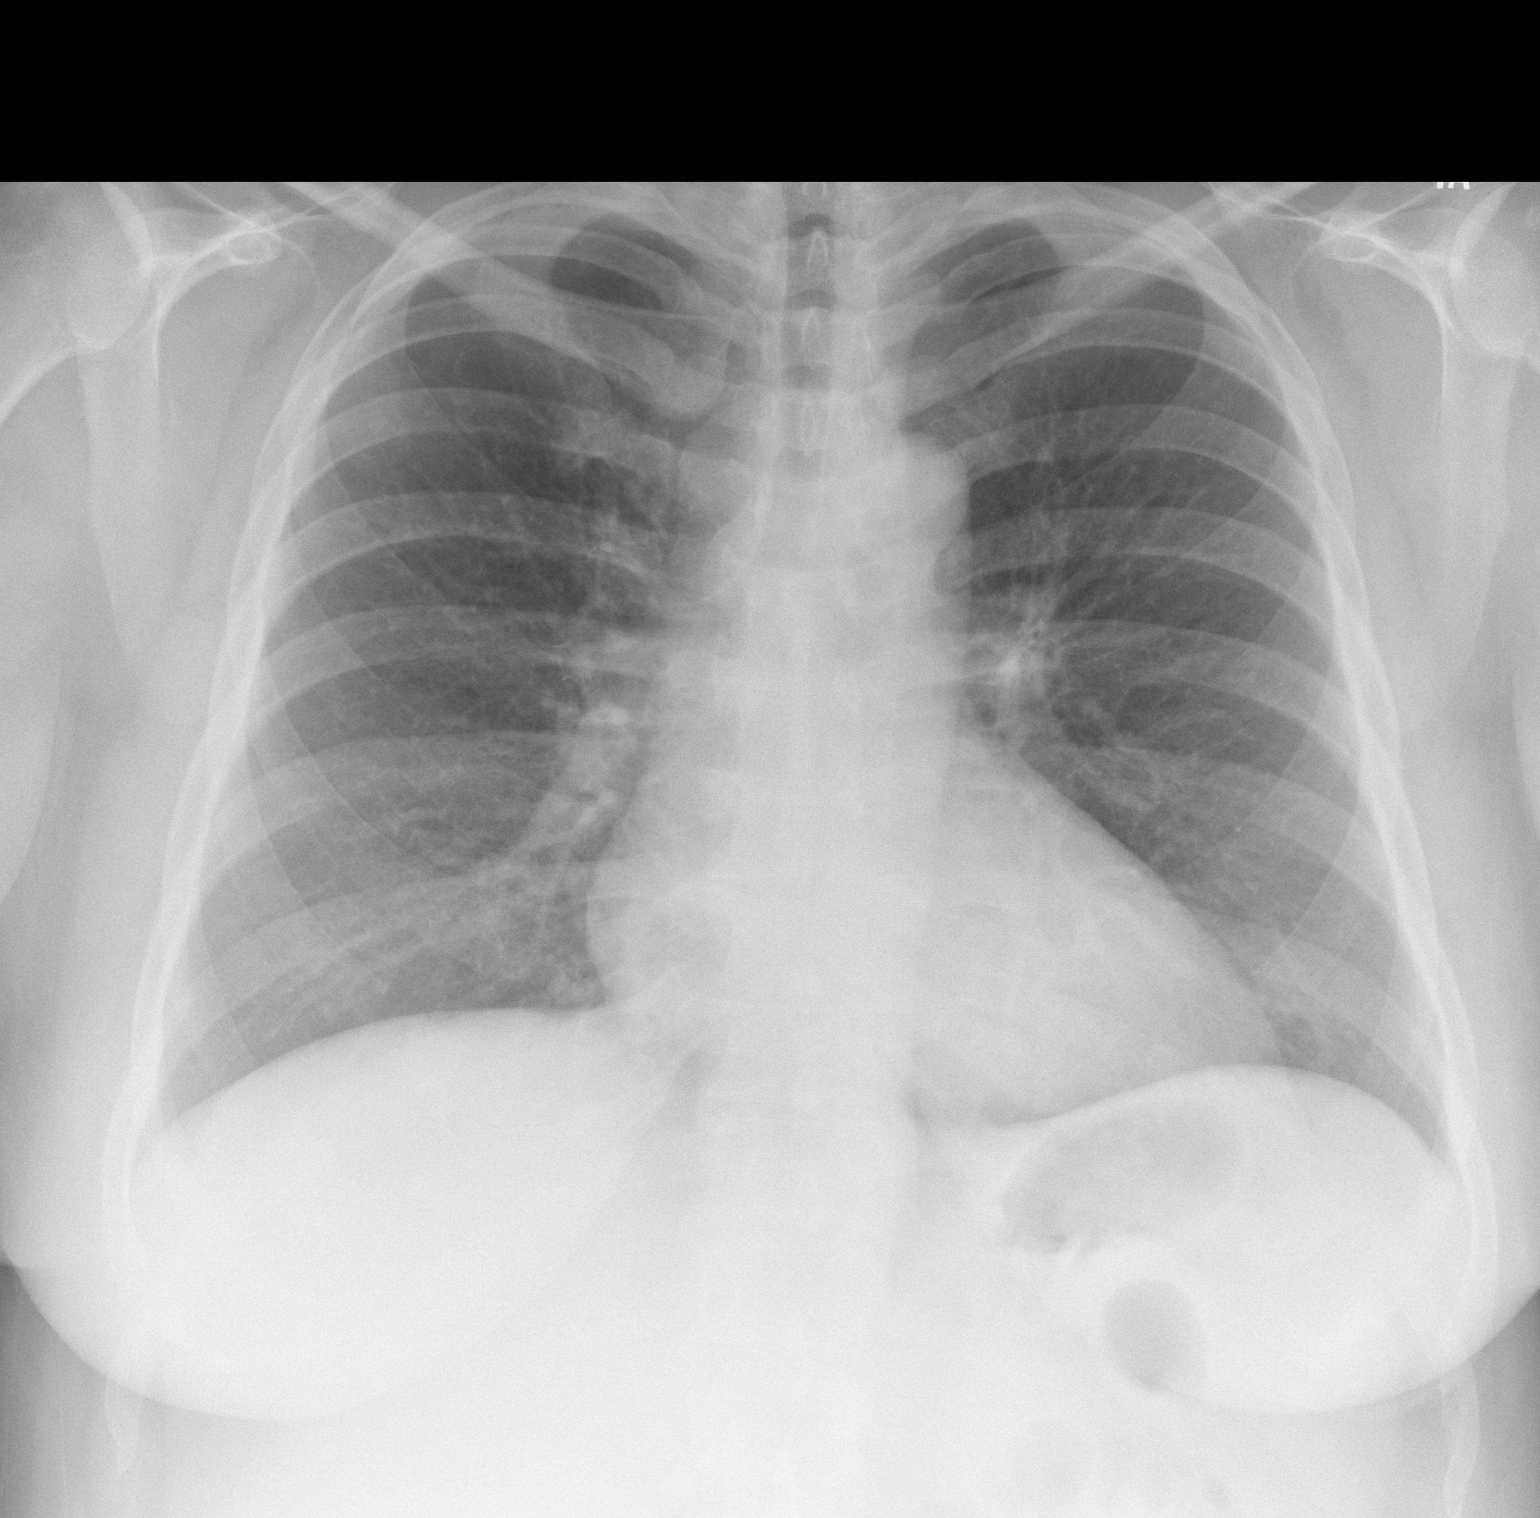

[w chest lat]
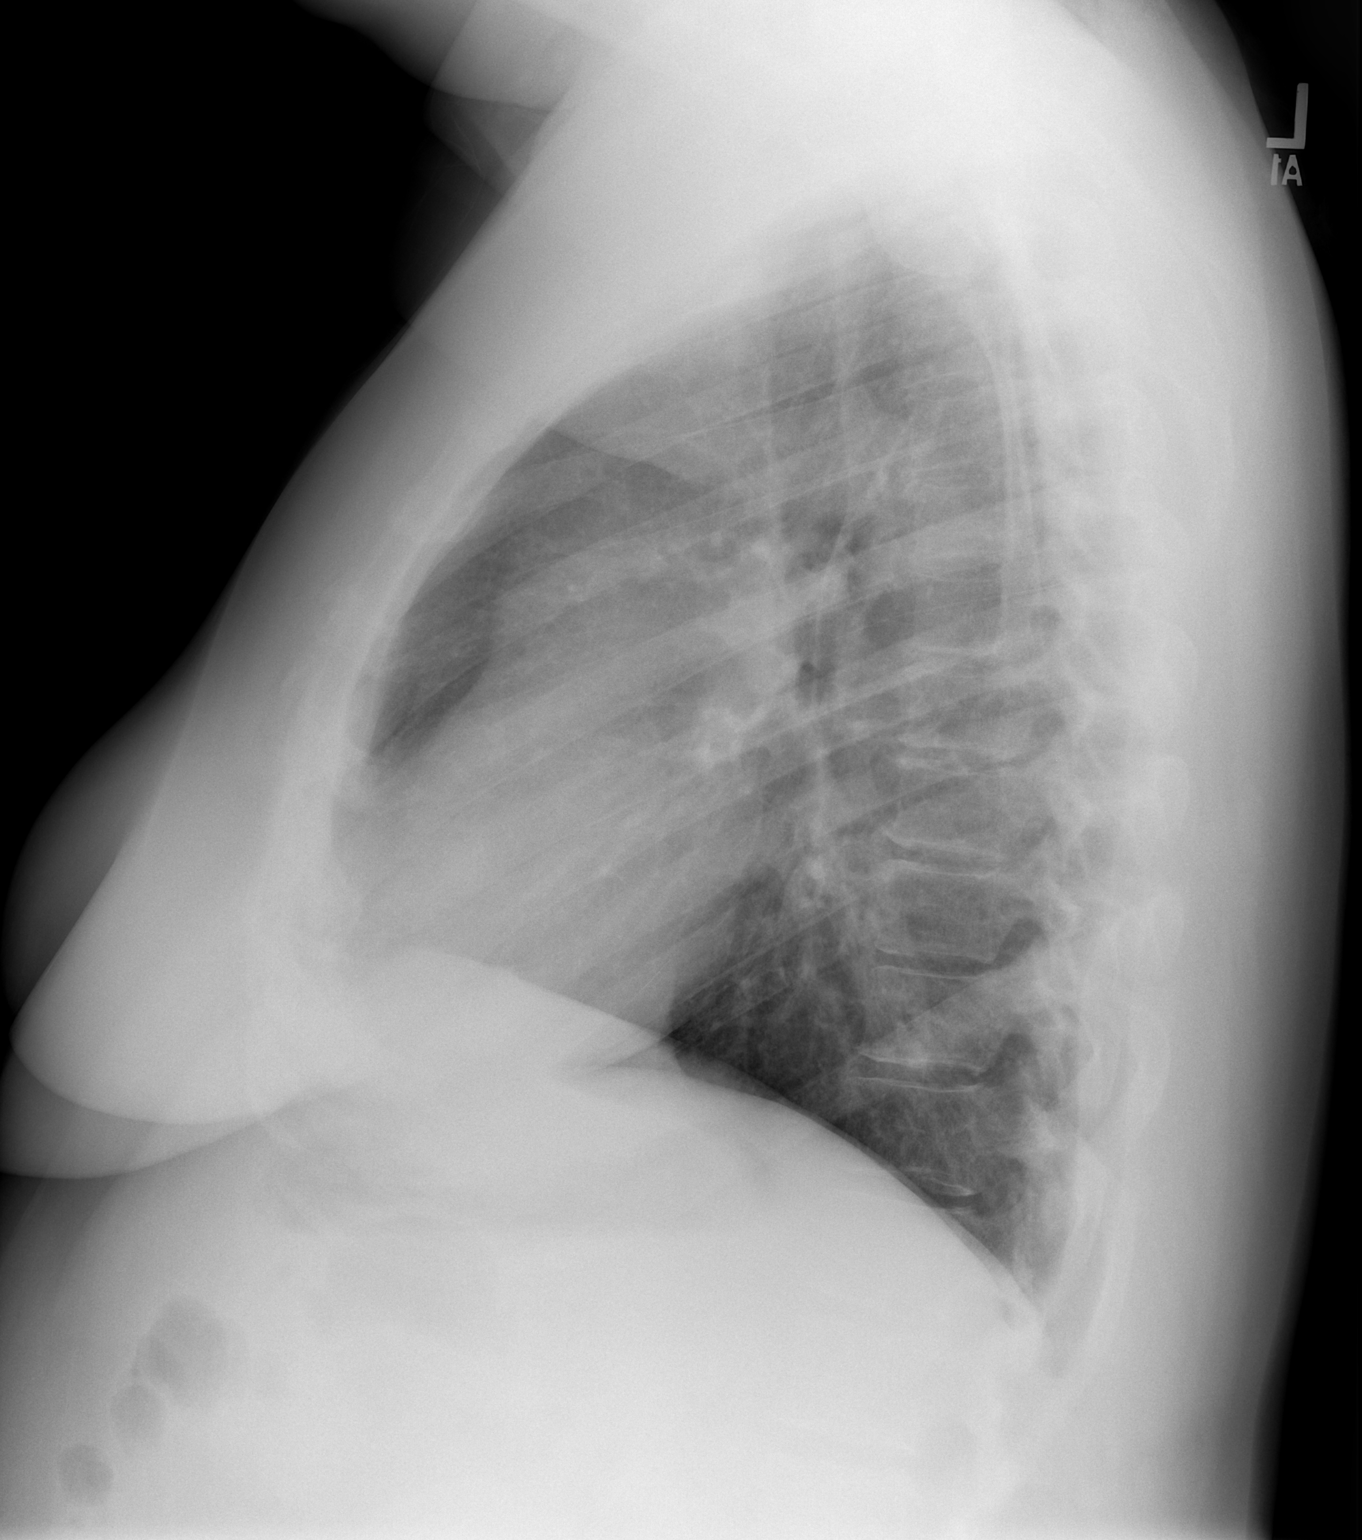

[2 of 2 positions shown; findings below may reference images not displayed]

FINDINGS: The heart size and mediastinal contours are within normal limits.
Both lungs are clear. The visualized skeletal structures are
unremarkable.
IMPRESSION: No active cardiopulmonary disease.

## 2018-06-12 ENCOUNTER — Emergency Department (HOSPITAL_BASED_OUTPATIENT_CLINIC_OR_DEPARTMENT_OTHER)
Admission: EM | Admit: 2018-06-12 | Discharge: 2018-06-12 | Disposition: A | Discharge status: Home / Self Care | Payer: 59 | Attending: Emergency Medicine | Admitting: Emergency Medicine

## 2018-06-12 ENCOUNTER — Other Ambulatory Visit: Payer: Self-pay

## 2018-06-12 ENCOUNTER — Encounter (HOSPITAL_BASED_OUTPATIENT_CLINIC_OR_DEPARTMENT_OTHER): Payer: Self-pay | Admitting: Emergency Medicine

## 2018-06-12 DIAGNOSIS — Z79899 Other long term (current) drug therapy: Secondary | ICD-10-CM | POA: Insufficient documentation

## 2018-06-12 DIAGNOSIS — F17210 Nicotine dependence, cigarettes, uncomplicated: Secondary | ICD-10-CM | POA: Insufficient documentation

## 2018-06-12 DIAGNOSIS — L03116 Cellulitis of left lower limb: Secondary | ICD-10-CM

## 2018-06-12 DIAGNOSIS — I10 Essential (primary) hypertension: Secondary | ICD-10-CM | POA: Insufficient documentation

## 2018-06-12 DIAGNOSIS — L0291 Cutaneous abscess, unspecified: Secondary | ICD-10-CM | POA: Insufficient documentation

## 2018-06-12 DIAGNOSIS — M79652 Pain in left thigh: Secondary | ICD-10-CM | POA: Diagnosis present

## 2018-06-12 MED ORDER — PROBIOTIC 250 MG PO CAPS: 1.0000 | ORAL_CAPSULE | Freq: Every day | ORAL | 0 refills | Status: DC

## 2018-06-12 MED ORDER — CLINDAMYCIN HCL 150 MG PO CAPS: 450.0000 mg | ORAL_CAPSULE | Freq: Four times a day (QID) | ORAL | 0 refills | Status: AC

## 2018-06-12 NOTE — Discharge Instructions (Signed)
Take clindamycin as prescribed until completed.  Take with a probiotic to help prevent stomach upset.  Use warm compresses on the area to help continue drainage.  Change dressing daily.  Please return to the emergency department in 2 days for wound recheck.  Please return sooner if you develop any increasing pain, redness, swelling, fevers, red streaking from the area.

## 2018-06-12 NOTE — ED Triage Notes (Signed)
Reports spider bite to left thigh since Friday. States she believes she got bit by brown recluse.  Abscess noted to inner left thigh.

## 2018-06-12 NOTE — ED Provider Notes (Signed)
[facility] EMERGENCY DEPARTMENT Provider Note   CSN: [id number] Arrival date & time: 06/12/18  [place]     History   Chief Complaint Chief Complaint  Patient presents with  . Insect Bite    HPI Shawna Harper is a 42 y.o. female with history of hypertension, GERD, hidradenitis suppurativa who presents with a abscess to left inner thigh that has been present for the past 5 days.  She believes she got bit by a spider, however did not see anything bite her.  She has had pain to the area and spread of erythema around.  She denies any fevers.  She has been feeling fine otherwise.  She has not tried anything at home for symptoms.  She reports she was recently diagnosed with hidradenitis separate fever and was on Bactrim for 3 months.  She finished this 1 month ago.  HPI  Past Medical History:  Diagnosis Date  . GERD (gastroesophageal reflux disease)   . Hypertension     There are no active problems to display for this patient.   Past Surgical History:  Procedure Laterality Date  . COLONOSCOPY    . TONSILLECTOMY    . UPPER GASTROINTESTINAL ENDOSCOPY  2005     OB History    Gravida  1   Para  1   Term  1   Preterm      AB      Living  1     SAB      TAB      Ectopic      Multiple      Live Births               Home Medications    Prior to Admission medications   Medication Sig Start Date End Date Taking? Authorizing Provider  Citalopram Hydrobromide (CELEXA PO) Take by mouth.   Yes [provider]  lisinopril (PRINIVIL,ZESTRIL) 10 MG tablet Take 10 mg by mouth daily.   Yes [provider]  albuterol (PROVENTIL HFA;VENTOLIN HFA) 108 (90 Base) MCG/ACT inhaler Inhale 1-2 puffs into the lungs every 6 (six) hours as needed for wheezing or shortness of breath. 12/18/16   Charlesetta Shanks, MD  clindamycin (CLEOCIN) 150 MG capsule Take 3 capsules (450 mg total) by mouth every 6 (six) hours for 7 days. 06/12/18 06/19/18  Frederica Kuster,  PA-C  fluconazole (DIFLUCAN) 150 MG tablet Take 1 tablet (150 mg total) by mouth once. Can take additional dose three days later if symptoms persist Patient not taking: Reported on 04/12/2016 04/03/16   Anyanwu, Sallyanne Havers, MD  HYDROcodone-homatropine (HYCODAN) 5-1.5 MG/5ML syrup Take 5 mLs by mouth every 6 (six) hours as needed for cough. 12/18/16   Charlesetta Shanks, MD  Saccharomyces boulardii (PROBIOTIC) 250 MG CAPS Take 1 capsule by mouth daily. 06/12/18   Frederica Kuster, PA-C  Spacer/Aero-Holding Chambers (AEROCHAMBER PLUS WITH MASK) inhaler Use as instructed 12/18/16   Charlesetta Shanks, MD  sulfamethoxazole-trimethoprim (BACTRIM DS,SEPTRA DS) 800-160 MG tablet Take 1 tablet by mouth 2 (two) times daily. 04/03/16   Osborne Oman, MD    Family History Family History  Problem Relation Age of Onset  . Cancer Mother 40       Non Hodgkins Lymph  . Diabetes Father   . Diabetes Paternal Grandmother   . Colon cancer Neg Hx   . Stomach cancer Neg Hx   . Stroke Neg Hx     Social History Social History   Tobacco Use  .  Smoking status: Current Every Day Smoker    Packs/day: 1.00    Types: Cigarettes    Start date: 11/20/1990  . Smokeless tobacco: Never Used  Substance Use Topics  . Alcohol use: Yes    Alcohol/week: 0.6 oz    Types: 1 Standard drinks or equivalent per week    Comment: mixed drinks, one a week  . Drug use: Yes    Types: Marijuana     Allergies   Vicodin [hydrocodone-acetaminophen]   Review of Systems Review of Systems  Constitutional: Negative for fever.  Skin: Positive for wound.     Physical Exam Updated Vital Signs BP 134/90 (BP Location: Left Arm)   Pulse 100   Temp 98.1 F (36.7 C) (Oral)   Resp 18   Ht 5\' 6"  (1.676 m)   Wt 94.8 kg (209 lb)   SpO2 96%   BMI 33.73 kg/m   Physical Exam  Constitutional: She appears well-developed and well-nourished. No distress.  HENT:  Head: Normocephalic and atraumatic.  Mouth/Throat: Oropharynx is clear and  moist. No oropharyngeal exudate.  Eyes: Pupils are equal, round, and reactive to light. Conjunctivae are normal. Right eye exhibits no discharge. Left eye exhibits no discharge. No scleral icterus.  Neck: Normal range of motion. Neck supple.  Cardiovascular: Normal rate, regular rhythm, normal heart sounds and intact distal pulses. Exam reveals no gallop and no friction rub.  No murmur heard. Pulmonary/Chest: Effort normal and breath sounds normal. No stridor. No respiratory distress. She has no wheezes. She has no rales.  Abdominal: Soft. Bowel sounds are normal. She exhibits no distension. There is no tenderness. There is no rebound and no guarding.  Musculoskeletal: She exhibits no edema.  Neurological: She is alert. Coordination normal.  Skin: Skin is warm and dry. No rash noted. She is not diaphoretic. No pallor.  1-1/2 cm pustule with surrounding induration and erythema extending to 5 cm diameter, tender  Psychiatric: She has a normal mood and affect.  Nursing note and vitals reviewed.    ED Treatments / Results  Labs (all labs ordered are listed, but only abnormal results are displayed) Labs Reviewed - No data to display  EKG None  Radiology No results found.  Procedures .Marland KitchenIncision and Drainage Date/Time: 06/12/2018 7:09 PM Performed by: Frederica Kuster, PA-C Authorized by: Frederica Kuster, PA-C   Consent:    Consent obtained:  Verbal   Consent given by:  Patient   Risks discussed:  Bleeding   Alternatives discussed:  No treatment Location:    Type:  Abscess   Size:  2   Location:  Lower extremity   Lower extremity location:  Leg   Leg location:  L upper leg Pre-procedure details:    Skin preparation:  Chloraprep Anesthesia (see MAR for exact dosages):    Anesthesia method:  None Procedure type:    Complexity:  Simple Procedure details:    Needle aspiration: no     Incision types:  Stab incision   Scalpel blade:  11   Wound management:  Irrigated with  saline   Drainage:  Purulent and bloody   Drainage amount:  Moderate   Wound treatment:  Wound left open   Packing materials:  None Post-procedure details:    Patient tolerance of procedure:  Tolerated well, no immediate complications   (including critical care time)  Medications Ordered in ED Medications - No data to display   Initial Impression / Assessment and Plan / ED Course  I have reviewed  the triage vital signs and the nursing notes.  Pertinent labs & imaging results that were available during my care of the patient were reviewed by me and considered in my medical decision making (see chart for details).     Patient with skin abscess.  She has no systemic symptoms and is afebrile.  Incision and drainage performed in the ED today.  Abscess was not large enough to warrant packing or drain placement. Wound recheck in 2 days. Supportive care and return precautions discussed.  Pt sent home with clindamycin and probiotic. The patient appears reasonably screened and/or stabilized for discharge and I doubt any other emergent medical condition requiring further screening, evaluation, or treatment in the ED prior to discharge.  Patient understands and agrees with plan.  Patient vitals stable throughout ED course and discharged in satisfactory condition.    Final Clinical Impressions(s) / ED Diagnoses   Final diagnoses:  Abscess  Cellulitis of left lower extremity    ED Discharge Orders        Ordered    clindamycin (CLEOCIN) 150 MG capsule  Every 6 hours     06/12/18 1903    Saccharomyces boulardii (PROBIOTIC) 250 MG CAPS  Daily     06/12/18 1903       Frederica Kuster, PA-C 06/12/18 1911    Margette Fast, MD 06/13/18 [phone]

## 2018-08-22 ENCOUNTER — Ambulatory Visit: Payer: Self-pay | Admitting: Mental Health

## 2018-11-01 ENCOUNTER — Encounter: Payer: Self-pay | Admitting: Emergency Medicine

## 2018-11-01 DIAGNOSIS — F411 Generalized anxiety disorder: Secondary | ICD-10-CM

## 2018-11-01 DIAGNOSIS — F329 Major depressive disorder, single episode, unspecified: Secondary | ICD-10-CM | POA: Insufficient documentation

## 2018-11-11 ENCOUNTER — Ambulatory Visit: Payer: 59 | Admitting: Physician Assistant

## 2018-11-11 ENCOUNTER — Encounter: Payer: Self-pay | Admitting: Physician Assistant

## 2018-11-11 DIAGNOSIS — F3341 Major depressive disorder, recurrent, in partial remission: Secondary | ICD-10-CM

## 2018-11-11 DIAGNOSIS — F411 Generalized anxiety disorder: Secondary | ICD-10-CM | POA: Diagnosis not present

## 2018-11-11 MED ORDER — CITALOPRAM HYDROBROMIDE 20 MG PO TABS: 30.0000 mg | ORAL_TABLET | Freq: Every day | ORAL | 1 refills | Status: DC

## 2018-11-11 MED ORDER — TRAZODONE HCL 50 MG PO TABS
50.0000 mg | ORAL_TABLET | Freq: Every evening | ORAL | 0 refills | Status: DC | PRN
Start: 2018-11-11 — End: 2018-12-03

## 2018-11-11 MED ORDER — ALPRAZOLAM 0.5 MG PO TABS: 0.5000 mg | ORAL_TABLET | Freq: Two times a day (BID) | ORAL | 1 refills | Status: DC | PRN

## 2018-11-11 NOTE — Progress Notes (Signed)
[facility]  Patient ID: Shawna Harper,  MRN: [id number]  PCP: Patient, No Pcp Per  Date of Evaluation: 11/11/2018 Time spent:15 minutes  Chief Complaint:  Chief Complaint    Follow-up; Insomnia      HISTORY/CURRENT STATUS: HPI here for 76-month med check.  Her only complaint today is not been able to sleep well.  She has a hard time going to sleep and staying asleep.  This started about 3 weeks ago.  She does not have any triggers and is unsure of the cause.  She is never had trouble sleeping before.  Patient denies loss of interest in usual activities and is able to enjoy things.  Denies decreased energy or motivation.  Appetite has not changed.  No extreme sadness, tearfulness, or feelings of hopelessness.  Denies any changes in concentration, making decisions or remembering things.  Denies suicidal or homicidal thoughts.  Anxiety is pretty well controlled.  She has taken the Xanax a couple of times to help her relax to get to sleep.  It has worked well.  Individual Medical History/ Review of Systems: Changes? :No    Past medications for mental health diagnoses include: Zoloft, which caused increased anxiety  Allergies: Vicodin [hydrocodone-acetaminophen]  Current Medications:  Current Outpatient Medications:  .  ALPRAZolam (XANAX) 0.5 MG tablet, Take 1 tablet (0.5 mg total) by mouth 2 (two) times daily as needed for anxiety., Disp: 30 tablet, Rfl: 1 .  citalopram (CELEXA) 20 MG tablet, Take 1.5 tablets (30 mg total) by mouth daily., Disp: 135 tablet, Rfl: 1 .  lisinopril (PRINIVIL,ZESTRIL) 10 MG tablet, Take 10 mg by mouth daily., Disp: , Rfl:  .  traZODone (DESYREL) 50 MG tablet, Take 1-2 tablets (50-100 mg total) by mouth at bedtime as needed for sleep., Disp: 60 tablet, Rfl: 0 Medication Side Effects: none  Family Medical/ Social History: Changes? No  MENTAL HEALTH EXAM:  There were no vitals taken for this visit.There is no height or weight on file to  calculate BMI.  General Appearance: Casual, Well Groomed and Obese  Eye Contact:  Good  Speech:  Clear and Coherent  Volume:  Normal  Mood:  Euthymic  Affect:  Appropriate  Thought Process:  Goal Directed  Orientation:  Full (Time, Place, and Person)  Thought Content: Logical   Suicidal Thoughts:  No  Homicidal Thoughts:  No  Memory:  WNL  Judgement:  Good  Insight:  Good  Psychomotor Activity:  Normal  Concentration:  Concentration: Good  Recall:  Good  Fund of Knowledge: Good  Language: Good  Assets:  Desire for Improvement  ADL's:  Intact  Cognition: WNL  Prognosis:  Good    DIAGNOSES:    ICD-10-CM   1. GAD (generalized anxiety disorder) F41.1   2. Recurrent major depressive disorder, in partial remission ([place]) F33.41     Receiving Psychotherapy: No    RECOMMENDATIONS: We discussed sleep hygiene. Trazodone 50 mg 1-2 nightly as needed sleep. Continue Celexa 30 mg daily.  Continue Xanax 0.5 mg half to 1 twice daily as needed. Return in 6 months or sooner as needed.   Donnal Moat, PA-C

## 2018-12-03 ENCOUNTER — Other Ambulatory Visit: Payer: Self-pay | Admitting: Physician Assistant

## 2019-01-07 ENCOUNTER — Encounter: Payer: Self-pay | Admitting: Gastroenterology

## 2019-01-07 ENCOUNTER — Ambulatory Visit: Payer: 59 | Admitting: Gastroenterology

## 2019-01-07 VITALS — BP 146/100 | HR 88 | Ht 66.0 in | Wt 228.0 lb

## 2019-01-07 DIAGNOSIS — R198 Other specified symptoms and signs involving the digestive system and abdomen: Secondary | ICD-10-CM | POA: Diagnosis not present

## 2019-01-07 DIAGNOSIS — K644 Residual hemorrhoidal skin tags: Secondary | ICD-10-CM | POA: Diagnosis not present

## 2019-01-07 MED ORDER — HYOSCYAMINE SULFATE 0.125 MG SL SUBL: 0.1250 mg | SUBLINGUAL_TABLET | Freq: Four times a day (QID) | SUBLINGUAL | 1 refills | Status: DC | PRN

## 2019-01-07 NOTE — Patient Instructions (Addendum)
Tablespoon of citrucel fiber in a glass of water once daily.  Preparation H and/or tucks pads for external hemorrhoids.  Hyoscyamine medicine twice daily to slow down bowels.  Let us know how things are doing.   If you are age 44 or older, your body mass index should be between 23-30. Your Body mass index is 36.8 kg/m. If this is out of the aforementioned range listed, please consider follow up with your Primary Care Provider.  If you are age 23 or younger, your body mass index should be between 19-25. Your Body mass index is 36.8 kg/m. If this is out of the aformentioned range listed, please consider follow up with your Primary Care Provider.   It was a pleasure to see you today!  Dr. Loletha Carrow

## 2019-01-07 NOTE — Progress Notes (Signed)
[facility] Consult Note:  History: Shawna Harper 01/07/2019  Referring physician: Self-referred  Reason for consult/chief complaint: Hemorrhoids (Painful and exterior ) and Constipation (Alternating with diarrhea, excessive gas)   Subjective  HPI:  Shawna Harper is self-referred for change in bowel habits and external hemorrhoids.  She had a colonoscopy performed for rectal bleeding by Dr. Ardis Hughs in August 2016.  A diminutive rectal hyperplastic polyp was removed.  External hemorrhoids were described.  It is difficult to tell from the photograph, but there may have been internal hemorrhoids as well.  For about the last 6 months she has a change in bowel habits with 8-10 BMs per day, formed and sometimes soft.  She will have excess gas as well, and feels like the other BM will be stool alternating with gas.  There is some urgency along with it as well, but no abdominal pain, nausea, vomiting, early satiety, dysphagia or weight loss. The perianal area has become chronically irritated and uncomfortable as a result.  ROS:  Review of Systems  Constitutional: Negative for appetite change and unexpected weight change.  HENT: Negative for mouth sores and voice change.   Eyes: Negative for pain and redness.  Respiratory: Negative for cough and shortness of breath.   Cardiovascular: Negative for chest pain and palpitations.  Genitourinary: Negative for dysuria and hematuria.  Musculoskeletal: Negative for arthralgias and myalgias.  Skin: Negative for pallor and rash.  Neurological: Negative for weakness and headaches.  Hematological: Negative for adenopathy.  Psychiatric/Behavioral:       Anxiety     Past Medical History: Past Medical History:  Diagnosis Date  . GERD (gastroesophageal reflux disease)   . Hypertension      Past Surgical History: Past Surgical History:  Procedure Laterality Date  . COLONOSCOPY    . TONSILLECTOMY    . UPPER GASTROINTESTINAL  ENDOSCOPY  2005     Family History: Family History  Problem Relation Age of Onset  . Cancer Mother 21       Non Hodgkins Lymph  . Diabetes Father   . Diabetes Paternal Grandmother   . Colon cancer Neg Hx   . Stomach cancer Neg Hx   . Stroke Neg Hx     Social History: Social History   Socioeconomic History  . Marital status: Single    Spouse name: Not on file  . Number of children: Not on file  . Years of education: Not on file  . Highest education level: Not on file  Occupational History  . Occupation: Haematologist: [employer]  . Financial resource strain: Not on file  . Food insecurity:    Worry: Not on file    Inability: Not on file  . Transportation needs:    Medical: Not on file    Non-medical: Not on file  Tobacco Use  . Smoking status: Current Every Day Smoker    Packs/day: 1.00    Types: Cigarettes    Start date: 11/20/1990  . Smokeless tobacco: Never Used  Substance and Sexual Activity  . Alcohol use: Yes    Alcohol/week: 2.0 standard drinks    Types: 2 Standard drinks or equivalent per week    Comment: mixed drinks, one a week  . Drug use: Yes    Types: Marijuana  . Sexual activity: Yes    Birth control/protection: I.U.D.  Lifestyle  . Physical activity:    Days per week: Not on file    Minutes per  session: Not on file  . Stress: Not on file  Relationships  . Social connections:    Talks on phone: Not on file    Gets together: Not on file    Attends religious service: Not on file    Active member of club or organization: Not on file    Attends meetings of clubs or organizations: Not on file    Relationship status: Not on file  Other Topics Concern  . Not on file  Social History Narrative  . Not on file    Allergies: Allergies  Allergen Reactions  . Vicodin [Hydrocodone-Acetaminophen] Itching    Outpatient Meds: Current Outpatient Medications  Medication Sig Dispense Refill  . ALPRAZolam (XANAX) 0.5 MG  tablet Take 1 tablet (0.5 mg total) by mouth 2 (two) times daily as needed for anxiety. 30 tablet 1  . citalopram (CELEXA) 20 MG tablet Take 1.5 tablets (30 mg total) by mouth daily. 135 tablet 1  . traZODone (DESYREL) 50 MG tablet TAKE 1-2 TABLETS (50-100 MG TOTAL) BY MOUTH AT BEDTIME AS NEEDED FOR SLEEP. 180 tablet 0  . hyoscyamine (LEVSIN SL) 0.125 MG SL tablet Place 1 tablet (0.125 mg total) under the tongue every 6 (six) hours as needed. 45 tablet 1  . lisinopril (PRINIVIL,ZESTRIL) 10 MG tablet Take 10 mg by mouth daily.     No current facility-administered medications for this visit.       ___________________________________________________________________ Objective   Exam:  BP (!) 146/100   Pulse 88   Ht 5\' 6"  (1.676 m)   Wt 228 lb (103.4 kg)   BMI 36.80 kg/m  Entire exam chaperoned by our MA June  General: Well-appearing woman  Eyes: sclera anicteric, no redness  ENT: oral mucosa moist without lesions, no cervical or supraclavicular lymphadenopathy  CV: RRR without murmur, S1/S2, no JVD, no peripheral edema  Resp: clear to auscultation bilaterally, normal RR and effort noted  GI: soft, no tenderness, with active bowel sounds. No guarding or palpable organomegaly noted.  Skin; warm and dry, no rash or jaundice noted  Neuro: awake, alert and oriented x 3. Normal gross motor function and fluent speech Rectal: Swollen, nonthrombosed external hemorrhoids.  DRE nontender, no fissure or internal lesions felt.  Soft stool in rectal vault. Increased anal sphincter tone precluding anoscopy   Assessment: Encounter Diagnoses  Name Primary?  . External hemorrhoids Yes  . Change in bowel function     Change in bowel habit, stool is not loose and watery, no pain.  Sounds most likely to be functional symptoms.  I think it is exacerbating her external hemorrhoids.  Plan:  Preparation H and/or witch hazel pads Moist toilet wipes rather than toilet paper Daily fiber  supplement and low-dose hyoscyamine Call as needed  Thank you for the courtesy of this consult.  Please call me with any questions or concerns.  Nelida Meuse III  CC: Referring provider noted above

## 2019-01-28 ENCOUNTER — Other Ambulatory Visit: Payer: Self-pay | Admitting: Gastroenterology

## 2019-05-01 ENCOUNTER — Other Ambulatory Visit: Payer: Self-pay | Admitting: Physician Assistant

## 2019-05-13 ENCOUNTER — Ambulatory Visit: Payer: 59 | Admitting: Physician Assistant

## 2019-06-12 ENCOUNTER — Ambulatory Visit: Payer: 59 | Admitting: Physician Assistant

## 2019-06-26 ENCOUNTER — Ambulatory Visit: Payer: 59 | Admitting: Physician Assistant

## 2019-06-26 ENCOUNTER — Other Ambulatory Visit: Payer: Self-pay

## 2019-08-07 ENCOUNTER — Encounter: Payer: Self-pay | Admitting: Physician Assistant

## 2019-08-07 ENCOUNTER — Other Ambulatory Visit: Payer: Self-pay

## 2019-08-07 ENCOUNTER — Ambulatory Visit (INDEPENDENT_AMBULATORY_CARE_PROVIDER_SITE_OTHER): Payer: 59 | Admitting: Physician Assistant

## 2019-08-07 DIAGNOSIS — F411 Generalized anxiety disorder: Secondary | ICD-10-CM | POA: Diagnosis not present

## 2019-08-07 DIAGNOSIS — F3341 Major depressive disorder, recurrent, in partial remission: Secondary | ICD-10-CM

## 2019-08-07 DIAGNOSIS — G4700 Insomnia, unspecified: Secondary | ICD-10-CM

## 2019-08-07 MED ORDER — TRAZODONE HCL 50 MG PO TABS: 50.0000 mg | ORAL_TABLET | Freq: Every evening | ORAL | 1 refills | Status: DC | PRN

## 2019-08-07 MED ORDER — ALPRAZOLAM 0.5 MG PO TABS: 0.5000 mg | ORAL_TABLET | Freq: Two times a day (BID) | ORAL | 1 refills | Status: DC | PRN

## 2019-08-07 MED ORDER — CITALOPRAM HYDROBROMIDE 40 MG PO TABS: 40.0000 mg | ORAL_TABLET | Freq: Every day | ORAL | 1 refills | Status: DC

## 2019-08-07 NOTE — Progress Notes (Signed)
[facility]  Patient ID: Shawna Harper,  MRN: [id number]  PCP: Patient, No Pcp Per  Date of Evaluation: 08/07/2019 Time spent:15 minutes  Chief Complaint:  Chief Complaint    Anxiety; Depression; Follow-up     Virtual Visit via Telephone Note  I connected with patient by a video enabled telemedicine application or telephone, with their informed consent, and verified patient privacy and that I am speaking with the correct person using two identifiers.  I am private, in my office and the patient is home.   I discussed the limitations, risks, security and privacy concerns of performing an evaluation and management service by telephone and the availability of in person appointments. I also discussed with the patient that there may be a patient responsible charge related to this service. The patient expressed understanding and agreed to proceed.   I discussed the assessment and treatment plan with the patient. The patient was provided an opportunity to ask questions and all were answered. The patient agreed with the plan and demonstrated an understanding of the instructions.   The patient was advised to call back or seek an in-person evaluation if the symptoms worsen or if the condition fails to improve as anticipated.  I provided 15 minutes of non-face-to-face time during this encounter.  HISTORY/CURRENT STATUS: HPI For routine med check.  She increased the Celexa about a month ago.  States she was feeling a little bit more emotional.  She was getting more anxious and tearful.  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  Has been on that dose of Celexa for quite a while and just felt like it was not working as well as it used to.  She already feels a lot better.  She is happy and content at home during of pandemic.  She is working from home and that is going well.  She is able to enjoy things.  Energy and motivation are good.  Not isolating any more than she has to.  Denies suicidal or  homicidal thoughts.  She does get anxious on occasion.  She does not take the Xanax very often but it is helpful when she does take it.  She rarely has a panic attack.  She will get more anxious if something triggers her, such as some kind of "drama" that she has to deal with.  That is not uncommon however.  She sleeps well with the trazodone, usually only taking 50 mg.  She feels rested when she gets up, not hung over.  Denies dizziness, syncope, seizures, numbness, tingling, tremor, tics, unsteady gait, slurred speech, confusion. Denies muscle or joint pain, stiffness, or dystonia.  Individual Medical History/ Review of Systems: Changes? :No    Past medications for mental health diagnoses include: Zoloft, which caused increased anxiety  Allergies: Vicodin [hydrocodone-acetaminophen]  Current Medications:  Current Outpatient Medications:  .  ALPRAZolam (XANAX) 0.5 MG tablet, Take 1 tablet (0.5 mg total) by mouth 2 (two) times daily as needed for anxiety., Disp: 30 tablet, Rfl: 1 .  hyoscyamine (LEVSIN SL) 0.125 MG SL tablet, PLACE 1 TABLET (0.125 MG TOTAL) UNDER THE TONGUE EVERY 6 (SIX) HOURS AS NEEDED., Disp: 45 tablet, Rfl: 1 .  traZODone (DESYREL) 50 MG tablet, Take 1-2 tablets (50-100 mg total) by mouth at bedtime as needed for sleep., Disp: 180 tablet, Rfl: 1 .  citalopram (CELEXA) 40 MG tablet, Take 1 tablet (40 mg total) by mouth daily., Disp: 90 tablet, Rfl: 1 .  lisinopril (PRINIVIL,ZESTRIL) 10 MG tablet, Take  10 mg by mouth daily., Disp: , Rfl:  Medication Side Effects: none  Family Medical/ Social History: Changes? No  MENTAL HEALTH EXAM:  There were no vitals taken for this visit.There is no height or weight on file to calculate BMI.  General Appearance: unable to assess  Eye Contact:  unable to assess  Speech:  Clear and Coherent  Volume:  Normal  Mood:  Euthymic  Affect:  unable to assess  Thought Process:  Goal Directed and Descriptions of Associations: Intact   Orientation:  Full (Time, Place, and Person)  Thought Content: Logical   Suicidal Thoughts:  No  Homicidal Thoughts:  No  Memory:  WNL  Judgement:  Good  Insight:  Good  Psychomotor Activity:  unable to assess  Concentration:  Concentration: Good  Recall:  Good  Fund of Knowledge: Good  Language: Good  Assets:  Desire for Improvement  ADL's:  Intact  Cognition: WNL  Prognosis:  Good    DIAGNOSES:    ICD-10-CM   1. GAD (generalized anxiety disorder)  F41.1   2. Recurrent major depressive disorder, in partial remission ([place])  F33.41   3. Insomnia, unspecified type  G47.00     Receiving Psychotherapy: No    RECOMMENDATIONS:  PDMP was reviewed. Continue Xanax 0.5 mg twice daily as needed. Continue Celexa 40 mg daily.  She is aware that this is the maximum dose.  If the depression or anxiety worsens again, contact me. Continue trazodone 50 mg, 1-2 nightly as needed sleep. Return in 6 months.  Donnal Moat, PA-C

## 2020-02-03 ENCOUNTER — Other Ambulatory Visit: Payer: Self-pay | Admitting: Physician Assistant

## 2020-02-03 ENCOUNTER — Ambulatory Visit (INDEPENDENT_AMBULATORY_CARE_PROVIDER_SITE_OTHER): Payer: 59 | Admitting: Physician Assistant

## 2020-02-03 ENCOUNTER — Other Ambulatory Visit: Payer: Self-pay

## 2020-02-03 ENCOUNTER — Encounter: Payer: Self-pay | Admitting: Physician Assistant

## 2020-02-03 DIAGNOSIS — F411 Generalized anxiety disorder: Secondary | ICD-10-CM

## 2020-02-03 DIAGNOSIS — F3341 Major depressive disorder, recurrent, in partial remission: Secondary | ICD-10-CM

## 2020-02-03 DIAGNOSIS — G4700 Insomnia, unspecified: Secondary | ICD-10-CM | POA: Diagnosis not present

## 2020-02-03 NOTE — Telephone Encounter (Signed)
Apt today at 430 

## 2020-02-03 NOTE — Progress Notes (Signed)
[facility]  Patient ID: Shawna Harper,  MRN: [id number]  PCP: Patient, No Pcp Per  Date of Evaluation: 02/03/2020 Time spent:15 minutes  Chief Complaint:  Chief Complaint    Anxiety; Depression      HISTORY/CURRENT STATUS: HPI For routine med check.  She is doing well.  She continues to work from home and has been for right at a year now due to the pandemic.  Really likes working from home and hopes she can do it from now on.  Is able to enjoy things.  Energy and motivation are good.  Not isolating any more than she has to.  Denies suicidal or homicidal thoughts.  She still takes the trazodone for sleep.  Usually she can take 1 pill and at [facility] is fine.  Even if she has to get up during the night she is able to go back to sleep quickly.  The next morning she does not feel any hangover effect.  Anxiety is well controlled.  She tries not to take the Xanax any more than she has to.  It is still effective when needed though.  Denies dizziness, syncope, seizures, numbness, tingling, tremor, tics, unsteady gait, slurred speech, confusion. Denies muscle or joint pain, stiffness, or dystonia.  Individual Medical History/ Review of Systems: Changes? :No    Past medications for mental health diagnoses include: Zoloft, which caused increased anxiety  Allergies: Vicodin [hydrocodone-acetaminophen]  Current Medications:  Current Outpatient Medications:  .  ALPRAZolam (XANAX) 0.5 MG tablet, Take 1 tablet (0.5 mg total) by mouth 2 (two) times daily as needed for anxiety., Disp: 30 tablet, Rfl: 1 .  hyoscyamine (LEVSIN SL) 0.125 MG SL tablet, PLACE 1 TABLET (0.125 MG TOTAL) UNDER THE TONGUE EVERY 6 (SIX) HOURS AS NEEDED., Disp: 45 tablet, Rfl: 1 .  traZODone (DESYREL) 50 MG tablet, Take 1-2 tablets (50-100 mg total) by mouth at bedtime as needed for sleep., Disp: 180 tablet, Rfl: 1 .  citalopram (CELEXA) 40 MG tablet, TAKE 1 TABLET BY MOUTH EVERY DAY, Disp: 90 tablet, Rfl: 1 .   lisinopril (PRINIVIL,ZESTRIL) 10 MG tablet, Take 10 mg by mouth daily., Disp: , Rfl:  Medication Side Effects: none  Family Medical/ Social History: Changes? No  MENTAL HEALTH EXAM:  There were no vitals taken for this visit.There is no height or weight on file to calculate BMI.  General Appearance: Casual, Neat and Well Groomed  Eye Contact:  Good  Speech:  Clear and Coherent  Volume:  Normal  Mood:  Euthymic  Affect:  Appropriate  Thought Process:  Goal Directed and Descriptions of Associations: Intact  Orientation:  Full (Time, Place, and Person)  Thought Content: Logical   Suicidal Thoughts:  No  Homicidal Thoughts:  No  Memory:  WNL  Judgement:  Good  Insight:  Good  Psychomotor Activity:  Normal  Concentration:  Concentration: Good  Recall:  Good  Fund of Knowledge: Good  Language: Good  Assets:  Desire for Improvement  ADL's:  Intact  Cognition: WNL  Prognosis:  Good    DIAGNOSES:    ICD-10-CM   1. GAD (generalized anxiety disorder)  F41.1   2. Recurrent major depressive disorder, in partial remission ([place])  F33.41   3. Insomnia, unspecified type  G47.00     Receiving Psychotherapy: No    RECOMMENDATIONS:  PDMP was reviewed. I spent 20 minutes with her. Continue Xanax 0.5 mg twice daily as needed. Continue Celexa 40 mg daily.   Continue trazodone 50 mg, 1-2  nightly as needed sleep. Return in 6 months.  Donnal Moat, PA-C

## 2020-02-04 ENCOUNTER — Other Ambulatory Visit: Payer: Self-pay | Admitting: Physician Assistant

## 2020-02-13 ENCOUNTER — Ambulatory Visit: Payer: 59 | Attending: Internal Medicine

## 2020-02-13 DIAGNOSIS — Z23 Encounter for immunization: Secondary | ICD-10-CM

## 2020-02-13 NOTE — Progress Notes (Signed)
   Covid-19 Vaccination Clinic  Name:  Jajaira Bohlman    MRN: [id number] DOB: 11-Apr-1976  02/13/2020  Ms. Fersch was observed post Covid-19 immunization for 15 minutes without incident. She was provided with Vaccine Information Sheet and instruction to access the V-Safe system.   Ms. Conry was instructed to call 911 with any severe reactions post vaccine: Marland Kitchen Difficulty breathing  . Swelling of face and throat  . A fast heartbeat  . A bad rash all over body  . Dizziness and weakness   Immunizations Administered    Name Date Dose VIS Date Route   Pfizer COVID-19 Vaccine 02/13/2020  9:48 AM 0.3 mL 10/31/2019 Intramuscular   Manufacturer: [place]   Lot: [id number]   [place]: [id number]

## 2020-03-08 ENCOUNTER — Ambulatory Visit: Payer: 59 | Attending: Internal Medicine

## 2020-03-08 DIAGNOSIS — Z23 Encounter for immunization: Secondary | ICD-10-CM

## 2020-03-08 NOTE — Progress Notes (Signed)
   Covid-19 Vaccination Clinic  Name:  Neelie Men    MRN: [id number] DOB: 10-06-1976  03/08/2020  Ms. Cayton was observed post Covid-19 immunization for 15 minutes without incident. She was provided with Vaccine Information Sheet and instruction to access the V-Safe system.   Ms. Gruwell was instructed to call 911 with any severe reactions post vaccine: Marland Kitchen Difficulty breathing  . Swelling of face and throat  . A fast heartbeat  . A bad rash all over body  . Dizziness and weakness   Immunizations Administered    Name Date Dose VIS Date Route   Pfizer COVID-19 Vaccine 03/08/2020  2:33 PM 0.3 mL 01/14/2019 Intramuscular   Manufacturer: [place]   Lot: [id number]   [place]: [id number]

## 2020-04-02 ENCOUNTER — Other Ambulatory Visit: Payer: Self-pay | Admitting: Physician Assistant

## 2020-07-25 ENCOUNTER — Other Ambulatory Visit: Payer: Self-pay | Admitting: Physician Assistant

## 2020-07-27 NOTE — Telephone Encounter (Signed)
Please review

## 2020-08-02 ENCOUNTER — Other Ambulatory Visit: Payer: Self-pay | Admitting: Physician Assistant

## 2020-08-02 NOTE — Telephone Encounter (Signed)
REVIEW

## 2020-08-03 ENCOUNTER — Ambulatory Visit (INDEPENDENT_AMBULATORY_CARE_PROVIDER_SITE_OTHER): Payer: 59 | Admitting: Physician Assistant

## 2020-08-03 ENCOUNTER — Encounter: Payer: Self-pay | Admitting: Physician Assistant

## 2020-08-03 ENCOUNTER — Other Ambulatory Visit: Payer: Self-pay

## 2020-08-03 DIAGNOSIS — F411 Generalized anxiety disorder: Secondary | ICD-10-CM

## 2020-08-03 DIAGNOSIS — F3341 Major depressive disorder, recurrent, in partial remission: Secondary | ICD-10-CM | POA: Diagnosis not present

## 2020-08-03 DIAGNOSIS — G4700 Insomnia, unspecified: Secondary | ICD-10-CM

## 2020-08-03 MED ORDER — ALPRAZOLAM 0.5 MG PO TABS: 0.5000 mg | ORAL_TABLET | Freq: Two times a day (BID) | ORAL | 2 refills | Status: DC | PRN

## 2020-08-03 NOTE — Progress Notes (Signed)
[facility]  Patient ID: Shawna Harper,  MRN: [id number]  PCP: Patient, No Pcp Per  Date of Evaluation: 08/03/2020 Time spent:20 minutes  Chief Complaint:  Chief Complaint    Anxiety; Depression; Insomnia      HISTORY/CURRENT STATUS: HPI For routine med check.  A lot of changes in the past month.  Had to put her dog down, 69 1/45 years old. Then she had to go back to work yesterday after working at home for 1 1/2 years.  Also, she moved in with her sister and her family.  She had been in an apartment for a long time and was really tired of apartment living, which was getting very expensive.  "There have been a lot of changes."  Even with all of it, states she is doing well and feels that the medications are working.  She does not take the trazodone every night.  It does still help when needed.  She has not had any Xanax for several months but states she would have taken if she had had some.  Does not take it very often but it is helpful.  Patient denies loss of interest in usual activities and is able to enjoy things.  Denies decreased energy or motivation.  Appetite has not changed.  No extreme sadness, tearfulness, or feelings of hopelessness.  Denies any changes in concentration, making decisions or remembering things.  Denies suicidal or homicidal thoughts.  Patient denies increased energy with decreased need for sleep, no increased talkativeness, no racing thoughts, no impulsivity or risky behaviors, no increased spending, no increased libido, no grandiosity, no increased irritability or anger, and no hallucinations.  Denies dizziness, syncope, seizures, numbness, tingling, tremor, tics, unsteady gait, slurred speech, confusion. Denies muscle or joint pain, stiffness, or dystonia.  Individual Medical History/ Review of Systems: Changes? :No    Past medications for mental health diagnoses include: Zoloft, which caused increased anxiety  Allergies: Vicodin  [hydrocodone-acetaminophen]  Current Medications:  Current Outpatient Medications:  .  ALPRAZolam (XANAX) 0.5 MG tablet, Take 1 tablet (0.5 mg total) by mouth 2 (two) times daily as needed for anxiety., Disp: 30 tablet, Rfl: 2 .  citalopram (CELEXA) 40 MG tablet, TAKE 1 TABLET BY MOUTH EVERY DAY, Disp: 90 tablet, Rfl: 1 .  traZODone (DESYREL) 50 MG tablet, TAKE 1-2 TABLETS (50-100 MG TOTAL) BY MOUTH AT BEDTIME AS NEEDED FOR SLEEP., Disp: 180 tablet, Rfl: 1 .  hyoscyamine (LEVSIN SL) 0.125 MG SL tablet, PLACE 1 TABLET (0.125 MG TOTAL) UNDER THE TONGUE EVERY 6 (SIX) HOURS AS NEEDED. (Patient not taking: Reported on 08/03/2020), Disp: 45 tablet, Rfl: 1 .  lisinopril (PRINIVIL,ZESTRIL) 10 MG tablet, Take 10 mg by mouth daily. (Patient not taking: Reported on 08/03/2020), Disp: , Rfl:  Medication Side Effects: none  Family Medical/ Social History: Changes? No  MENTAL HEALTH EXAM:  There were no vitals taken for this visit.There is no height or weight on file to calculate BMI.  General Appearance: Casual, Neat and Well Groomed  Eye Contact:  Good  Speech:  Clear and Coherent  Volume:  Normal  Mood:  Euthymic  Affect:  Appropriate  Thought Process:  Goal Directed and Descriptions of Associations: Intact  Orientation:  Full (Time, Place, and Person)  Thought Content: Logical   Suicidal Thoughts:  No  Homicidal Thoughts:  No  Memory:  WNL  Judgement:  Good  Insight:  Good  Psychomotor Activity:  Normal  Concentration:  Concentration: Good  Recall:  [place]  of Knowledge: Good  Language: Good  Assets:  Desire for Improvement  ADL's:  Intact  Cognition: WNL  Prognosis:  Good    DIAGNOSES:    ICD-10-CM   1. Recurrent major depressive disorder, in partial remission ([place])  F33.41   2. GAD (generalized anxiety disorder)  F41.1   3. Insomnia, unspecified type  G47.00     Receiving Psychotherapy: No    RECOMMENDATIONS:  PDMP was reviewed. I provided 20 minutes of face-to-face time  during this encounter. My condolences with the death of her dog.. Continue Xanax 0.5 mg twice daily as needed. Continue Celexa 40 mg daily.   Continue trazodone 50 mg, 1-2 nightly as needed sleep. Return in 6 months.  Donnal Moat, PA-C

## 2021-01-14 ENCOUNTER — Other Ambulatory Visit: Payer: Self-pay | Admitting: Physician Assistant

## 2021-01-31 ENCOUNTER — Ambulatory Visit (INDEPENDENT_AMBULATORY_CARE_PROVIDER_SITE_OTHER): Payer: 59 | Admitting: Physician Assistant

## 2021-01-31 ENCOUNTER — Encounter: Payer: Self-pay | Admitting: Physician Assistant

## 2021-01-31 ENCOUNTER — Other Ambulatory Visit: Payer: Self-pay

## 2021-01-31 DIAGNOSIS — F411 Generalized anxiety disorder: Secondary | ICD-10-CM | POA: Diagnosis not present

## 2021-01-31 DIAGNOSIS — G4700 Insomnia, unspecified: Secondary | ICD-10-CM

## 2021-01-31 DIAGNOSIS — F3341 Major depressive disorder, recurrent, in partial remission: Secondary | ICD-10-CM | POA: Diagnosis not present

## 2021-01-31 MED ORDER — ALPRAZOLAM 0.5 MG PO TABS: 0.5000 mg | ORAL_TABLET | Freq: Two times a day (BID) | ORAL | 5 refills | Status: DC | PRN

## 2021-01-31 NOTE — Progress Notes (Signed)
[facility]  Patient ID: Shawna Harper,  MRN: [id number]  PCP: Patient, No Pcp Per  Date of Evaluation: 01/31/2021 Time spent:20 minutes  Chief Complaint:  Chief Complaint    Anxiety; Depression; Insomnia; Follow-up      HISTORY/CURRENT STATUS: HPI For routine med check.  Stressed with work, was working from home for 1 1/2 years, then went back ([employer]) then they had everyone work from home again. Prefers to work from home, gets more done she thinks and definitely likes being more isolated when working.   Able to enjoy things when she can.  Personal hygiene is normal. Weight is stable. Is a homebody but that's not new. Still lives with her sister and her family. Sleeps good with the Trazodone. No SI/HI.  Patient denies increased energy with decreased need for sleep, no increased talkativeness, no racing thoughts, no impulsivity or risky behaviors, no increased spending, no increased libido, no grandiosity, no increased irritability or anger, and no hallucinations.  Denies dizziness, syncope, seizures, numbness, tingling, tremor, tics, unsteady gait, slurred speech, confusion. Denies muscle or joint pain, stiffness, or dystonia.  Individual Medical History/ Review of Systems: Changes? :No    Past medications for mental health diagnoses include: Zoloft, which caused increased anxiety  Allergies: Vicodin [hydrocodone-acetaminophen]  Current Medications:  Current Outpatient Medications:  .  citalopram (CELEXA) 40 MG tablet, TAKE 1 TABLET BY MOUTH EVERY DAY, Disp: 90 tablet, Rfl: 1 .  lisinopril (PRINIVIL,ZESTRIL) 10 MG tablet, Take 10 mg by mouth daily., Disp: , Rfl:  .  traZODone (DESYREL) 50 MG tablet, TAKE 1-2 TABLETS (50-100 MG TOTAL) BY MOUTH AT BEDTIME AS NEEDED FOR SLEEP., Disp: 180 tablet, Rfl: 1 .  ALPRAZolam (XANAX) 0.5 MG tablet, Take 1 tablet (0.5 mg total) by mouth 2 (two) times daily as needed for anxiety., Disp: 30 tablet, Rfl: 5 .  hyoscyamine  (LEVSIN SL) 0.125 MG SL tablet, PLACE 1 TABLET (0.125 MG TOTAL) UNDER THE TONGUE EVERY 6 (SIX) HOURS AS NEEDED. (Patient not taking: No sig reported), Disp: 45 tablet, Rfl: 1 .  omeprazole (PRILOSEC) 20 MG capsule, SMARTSIG:1 Capsule(s) By Mouth Morning-Night, Disp: , Rfl:  Medication Side Effects: none  Family Medical/ Social History: Changes? No  MENTAL HEALTH EXAM:  There were no vitals taken for this visit.There is no height or weight on file to calculate BMI.  General Appearance: Casual, Neat, Well Groomed and Obese  Eye Contact:  Good  Speech:  Clear and Coherent  Volume:  Normal  Mood:  Euthymic  Affect:  Appropriate  Thought Process:  Goal Directed and Descriptions of Associations: Intact  Orientation:  Full (Time, Place, and Person)  Thought Content: Logical   Suicidal Thoughts:  No  Homicidal Thoughts:  No  Memory:  WNL  Judgement:  Good  Insight:  Good  Psychomotor Activity:  Normal  Concentration:  Concentration: Good and Attention Span: Good  Recall:  Good  Fund of Knowledge: Good  Language: Good  Assets:  Desire for Improvement  ADL's:  Intact  Cognition: WNL  Prognosis:  Good    DIAGNOSES:    ICD-10-CM   1. Recurrent major depressive disorder, in partial remission ([place])  F33.41   2. GAD (generalized anxiety disorder)  F41.1   3. Insomnia, unspecified type  G47.00     Receiving Psychotherapy: No    RECOMMENDATIONS:  PDMP was reviewed. I provided 20 minutes of face-to-face time during this encounter, including time spent before and after the visit in record review and  charting.  Continue Xanax 0.5 mg twice daily as needed. Continue Celexa 40 mg daily.   Continue trazodone 50 mg, 1-2 nightly as needed sleep. Return in 6 months.  Donnal Moat, PA-C

## 2021-02-02 ENCOUNTER — Telehealth: Payer: Self-pay | Admitting: Physician Assistant

## 2021-02-02 NOTE — Telephone Encounter (Signed)
Received Work from home questionnaire. Placed on Traci's desk.

## 2021-02-07 DIAGNOSIS — Z0289 Encounter for other administrative examinations: Secondary | ICD-10-CM

## 2021-02-07 NOTE — Telephone Encounter (Signed)
Paper work completed and given to Teresa to review and sign. 

## 2021-04-01 ENCOUNTER — Emergency Department (HOSPITAL_BASED_OUTPATIENT_CLINIC_OR_DEPARTMENT_OTHER)
Admission: EM | Admit: 2021-04-01 | Discharge: 2021-04-01 | Disposition: A | Discharge status: Home / Self Care | Payer: 59 | Attending: Emergency Medicine | Admitting: Emergency Medicine

## 2021-04-01 ENCOUNTER — Other Ambulatory Visit: Payer: Self-pay

## 2021-04-01 ENCOUNTER — Encounter (HOSPITAL_BASED_OUTPATIENT_CLINIC_OR_DEPARTMENT_OTHER): Payer: Self-pay | Admitting: Emergency Medicine

## 2021-04-01 DIAGNOSIS — Z79899 Other long term (current) drug therapy: Secondary | ICD-10-CM | POA: Diagnosis not present

## 2021-04-01 DIAGNOSIS — G510 Bell's palsy: Secondary | ICD-10-CM | POA: Diagnosis not present

## 2021-04-01 DIAGNOSIS — R29810 Facial weakness: Secondary | ICD-10-CM | POA: Diagnosis present

## 2021-04-01 DIAGNOSIS — I10 Essential (primary) hypertension: Secondary | ICD-10-CM | POA: Diagnosis not present

## 2021-04-01 DIAGNOSIS — F17210 Nicotine dependence, cigarettes, uncomplicated: Secondary | ICD-10-CM | POA: Diagnosis not present

## 2021-04-01 MED ORDER — PREDNISONE 50 MG PO TABS
60.0000 mg | ORAL_TABLET | Freq: Once | ORAL | Status: AC
  Administered 2021-04-01: 60 mg via ORAL
  Filled 2021-04-01: qty 1

## 2021-04-01 MED ORDER — VALACYCLOVIR HCL 500 MG PO TABS
1000.0000 mg | ORAL_TABLET | Freq: Once | ORAL | Status: AC
  Administered 2021-04-01: 1000 mg via ORAL
  Filled 2021-04-01: qty 2

## 2021-04-01 MED ORDER — PREDNISONE 20 MG PO TABS: 60.0000 mg | ORAL_TABLET | Freq: Every day | ORAL | 0 refills | Status: AC

## 2021-04-01 MED ORDER — VALACYCLOVIR HCL 1 G PO TABS: 1000.0000 mg | ORAL_TABLET | Freq: Three times a day (TID) | ORAL | 0 refills | Status: AC

## 2021-04-01 NOTE — ED Provider Notes (Signed)
[facility] Provider Note   CSN: [id number] Arrival date & time: 04/01/21  2127     History Chief Complaint  Patient presents with  . Facial Droop    Shawna Harper is a 45 y.o. female.  Pt presents to the ED today with a left sided facial droop.  Pt said she woke up from a nap around 1915 and her right eye felt funny.  She looked in the mirror and saw a left sided facial droop.  She denies any other neurologic sx.  She has never had this in the past.        Past Medical History:  Diagnosis Date  . GERD (gastroesophageal reflux disease)   . Hypertension     Patient Active Problem List   Diagnosis Date Noted  . MDD (major depressive disorder) 11/01/2018  . GAD (generalized anxiety disorder) 11/01/2018    Past Surgical History:  Procedure Laterality Date  . COLONOSCOPY    . TONSILLECTOMY    . UPPER GASTROINTESTINAL ENDOSCOPY  2005     OB History    Gravida  1   Para  1   Term  1   Preterm      AB      Living  1     SAB      IAB      Ectopic      Multiple      Live Births              Family History  Problem Relation Age of Onset  . Cancer Mother 30       Non Hodgkins Lymph  . Diabetes Father   . Diabetes Paternal Grandmother   . Colon cancer Neg Hx   . Stomach cancer Neg Hx   . Stroke Neg Hx     Social History   Tobacco Use  . Smoking status: Current Every Day Smoker    Packs/day: 1.00    Types: Cigarettes    Start date: 11/20/1990  . Smokeless tobacco: Never Used  Substance Use Topics  . Alcohol use: Yes    Alcohol/week: 2.0 standard drinks    Types: 2 Standard drinks or equivalent per week  . Drug use: Yes    Frequency: 2.0 times per week    Types: Marijuana    Home Medications Prior to Admission medications   Medication Sig Start Date End Date Taking? Authorizing Provider  predniSONE (DELTASONE) 20 MG tablet Take 3 tablets (60 mg total) by mouth daily for 7 days. 04/01/21 04/08/21 Yes  Isla Pence, MD  valACYclovir (VALTREX) 1000 MG tablet Take 1 tablet (1,000 mg total) by mouth 3 (three) times daily for 7 days. 04/01/21 04/08/21 Yes Isla Pence, MD  ALPRAZolam Duanne Moron) 0.5 MG tablet Take 1 tablet (0.5 mg total) by mouth 2 (two) times daily as needed for anxiety. 01/31/21   Donnal Moat T, PA-C  citalopram (CELEXA) 40 MG tablet TAKE 1 TABLET BY MOUTH EVERY DAY 01/14/21   Donnal Moat T, PA-C  hyoscyamine (LEVSIN SL) 0.125 MG SL tablet PLACE 1 TABLET (0.125 MG TOTAL) UNDER THE TONGUE EVERY 6 (SIX) HOURS AS NEEDED. Patient not taking: No sig reported 01/28/19   Doran Stabler, MD  lisinopril (PRINIVIL,ZESTRIL) 10 MG tablet Take 10 mg by mouth daily.    [provider]  omeprazole (PRILOSEC) 20 MG capsule SMARTSIG:1 Capsule(s) By Mouth Morning-Night 12/07/20   [provider]  traZODone (DESYREL) 50 MG tablet TAKE 1-2 TABLETS (  50-100 MG TOTAL) BY MOUTH AT BEDTIME AS NEEDED FOR SLEEP. 01/14/21   Addison Lank, PA-C    Allergies    Vicodin [hydrocodone-acetaminophen]  Review of Systems   Review of Systems  HENT:       Facial droop  All other systems reviewed and are negative.   Physical Exam Updated Vital Signs BP (!) 140/104 (BP Location: Right Arm)   Pulse (!) 101   Temp 98.9 F (37.2 C) (Oral)   Resp 18   Ht 5\' 6"  (1.676 m)   Wt 104.6 kg   SpO2 97%   BMI 37.22 kg/m   Physical Exam Vitals and nursing note reviewed.  Constitutional:      Appearance: Normal appearance.  HENT:     Head: Normocephalic and atraumatic.     Comments: Left sided facial droop c/w bells palsy    Right Ear: External ear normal.     Left Ear: External ear normal.     Nose: Nose normal.     Mouth/Throat:     Mouth: Mucous membranes are moist.     Pharynx: Oropharynx is clear.  Eyes:     Extraocular Movements: Extraocular movements intact.     Conjunctiva/sclera: Conjunctivae normal.     Pupils: Pupils are equal, round, and reactive to light.   Cardiovascular:     Rate and Rhythm: Normal rate and regular rhythm.     Pulses: Normal pulses.     Heart sounds: Normal heart sounds.  Pulmonary:     Effort: Pulmonary effort is normal.     Breath sounds: Normal breath sounds.  Abdominal:     General: Abdomen is flat. Bowel sounds are normal.     Palpations: Abdomen is soft.  Musculoskeletal:        General: Normal range of motion.     Cervical back: Normal range of motion and neck supple.  Skin:    General: Skin is warm.     Capillary Refill: Capillary refill takes less than 2 seconds.  Neurological:     General: No focal deficit present.     Mental Status: She is alert and oriented to person, place, and time.  Psychiatric:        Mood and Affect: Mood normal.        Behavior: Behavior normal.     ED Results / Procedures / Treatments   Labs (all labs ordered are listed, but only abnormal results are displayed) Labs Reviewed - No data to display  EKG None  Radiology No results found.  Procedures Procedures   Medications Ordered in ED Medications  predniSONE (DELTASONE) tablet 60 mg (has no administration in time range)  valACYclovir (VALTREX) tablet 1,000 mg (has no administration in time range)    ED Course  I have reviewed the triage vital signs and the nursing notes.  Pertinent labs & imaging results that were available during my care of the patient were reviewed by me and considered in my medical decision making (see chart for details).    MDM Rules/Calculators/A&P                          Pt will be started on prednisone and valtrex.  She is to return if worse.  F/u with pcp.  Final Clinical Impression(s) / ED Diagnoses Final diagnoses:  Bell's palsy    Rx / DC Orders ED Discharge Orders         Ordered  predniSONE (DELTASONE) 20 MG tablet  Daily        04/01/21 2201    valACYclovir (VALTREX) 1000 MG tablet  3 times daily        04/01/21 2201           Isla Pence, MD 04/01/21  2204

## 2021-04-01 NOTE — ED Triage Notes (Signed)
Patient presents with complaints of left side facial droop and right eye heaviness. Onset 1915 tonight. Ambulatory with steady gait.

## 2021-07-11 ENCOUNTER — Other Ambulatory Visit: Payer: Self-pay | Admitting: Physician Assistant

## 2021-08-04 ENCOUNTER — Other Ambulatory Visit: Payer: Self-pay

## 2021-08-04 ENCOUNTER — Ambulatory Visit: Payer: 59 | Admitting: Physician Assistant

## 2021-08-04 ENCOUNTER — Encounter: Payer: Self-pay | Admitting: Physician Assistant

## 2021-08-04 DIAGNOSIS — F411 Generalized anxiety disorder: Secondary | ICD-10-CM

## 2021-08-04 DIAGNOSIS — G4700 Insomnia, unspecified: Secondary | ICD-10-CM

## 2021-08-04 DIAGNOSIS — F3341 Major depressive disorder, recurrent, in partial remission: Secondary | ICD-10-CM | POA: Diagnosis not present

## 2021-08-04 MED ORDER — TRAZODONE HCL 50 MG PO TABS: 50.0000 mg | ORAL_TABLET | Freq: Every evening | ORAL | 1 refills | Status: DC | PRN

## 2021-08-04 MED ORDER — ALPRAZOLAM 0.5 MG PO TABS: 0.5000 mg | ORAL_TABLET | Freq: Two times a day (BID) | ORAL | 5 refills | Status: DC | PRN

## 2021-08-04 MED ORDER — CITALOPRAM HYDROBROMIDE 40 MG PO TABS: 40.0000 mg | ORAL_TABLET | Freq: Every day | ORAL | 1 refills | Status: DC

## 2021-08-04 NOTE — Progress Notes (Signed)
[facility]  Patient ID: Shawna Harper,  MRN: [id number]  PCP: Lorenda Hatchet, FNP  Date of Evaluation: 08/04/2021 Time spent:20 minutes  Chief Complaint:  Chief Complaint   Depression; Anxiety; Insomnia; Follow-up     HISTORY/CURRENT STATUS: HPI For routine med check.  Doing well. Patient denies loss of interest in usual activities and is able to enjoy things.  Denies decreased energy or motivation.  Appetite has not changed.  No extreme sadness, tearfulness, or feelings of hopelessness.  Denies any changes in concentration, making decisions or remembering things.  Denies suicidal or homicidal thoughts.  Anxiety is well-controlled. Takes the Xanax prn. Sleeps well w/ trazodone.  She only takes it maybe 5 nights a week.  She does not have a hangover with it.  Patient denies increased energy with decreased need for sleep, no increased talkativeness, no racing thoughts, no impulsivity or risky behaviors, no increased spending, no increased libido, no grandiosity, no increased irritability or anger, and no hallucinations.  Denies dizziness, syncope, seizures, numbness, tingling, tremor, tics, unsteady gait, slurred speech, confusion. Denies muscle or joint pain, stiffness, or dystonia.  Individual Medical History/ Review of Systems: Changes? :Yes    Had Bell's Palsy since LOV but recovered well.   Past medications for mental health diagnoses include: Zoloft, which caused increased anxiety  Allergies: Vicodin [hydrocodone-acetaminophen]  Current Medications:  Current Outpatient Medications:    lisinopril (PRINIVIL,ZESTRIL) 10 MG tablet, Take 10 mg by mouth daily., Disp: , Rfl:    omeprazole (PRILOSEC) 20 MG capsule, SMARTSIG:1 Capsule(s) By Mouth Morning-Night, Disp: , Rfl:    ALPRAZolam (XANAX) 0.5 MG tablet, Take 1 tablet (0.5 mg total) by mouth 2 (two) times daily as needed for anxiety., Disp: 30 tablet, Rfl: 5   citalopram (CELEXA) 40 MG tablet, Take 1 tablet  (40 mg total) by mouth daily., Disp: 90 tablet, Rfl: 1   traZODone (DESYREL) 50 MG tablet, Take 1-2 tablets (50-100 mg total) by mouth at bedtime as needed. for sleep, Disp: 180 tablet, Rfl: 1 Medication Side Effects: none  Family Medical/ Social History: Changes? Moved out from her sister's home about 3 weeks ago into an apartment where she lived before. Very happy.   MENTAL HEALTH EXAM:  There were no vitals taken for this visit.There is no height or weight on file to calculate BMI.  General Appearance: Casual, Neat, Well Groomed and Obese  Eye Contact:  Good  Speech:  Clear and Coherent  Volume:  Normal  Mood:  Euthymic  Affect:  Appropriate  Thought Process:  Goal Directed and Descriptions of Associations: Circumstantial  Orientation:  Full (Time, Place, and Person)  Thought Content: Logical   Suicidal Thoughts:  No  Homicidal Thoughts:  No  Memory:  WNL  Judgement:  Good  Insight:  Good  Psychomotor Activity:  Normal  Concentration:  Concentration: Good and Attention Span: Good  Recall:  Good  Fund of Knowledge: Good  Language: Good  Assets:  Desire for Improvement  ADL's:  Intact  Cognition: WNL  Prognosis:  Good    DIAGNOSES:    ICD-10-CM   1. Recurrent major depressive disorder, in partial remission ([place])  F33.41     2. GAD (generalized anxiety disorder)  F41.1     3. Insomnia, unspecified type  G47.00        Receiving Psychotherapy: No    RECOMMENDATIONS:  PDMP was reviewed.  Last Xanax was filled 07/05/2021. I provided 20 minutes of face to face time during this encounter, including  time spent before and after the visit in records review, medical decision making, and charting.  She is doing well so no changes in meds are necessary. Continue Xanax 0.5 mg twice daily as needed. Continue Celexa 40 mg daily.   Continue trazodone 50 mg, 1-2 nightly as needed sleep. Return in 6 months.  Donnal Moat, PA-C

## 2021-10-12 ENCOUNTER — Other Ambulatory Visit: Payer: Self-pay | Admitting: Physician Assistant

## 2022-02-01 ENCOUNTER — Other Ambulatory Visit: Payer: Self-pay

## 2022-02-01 ENCOUNTER — Ambulatory Visit (INDEPENDENT_AMBULATORY_CARE_PROVIDER_SITE_OTHER): Payer: 59 | Admitting: Physician Assistant

## 2022-02-01 ENCOUNTER — Encounter: Payer: Self-pay | Admitting: Physician Assistant

## 2022-02-01 DIAGNOSIS — F3341 Major depressive disorder, recurrent, in partial remission: Secondary | ICD-10-CM

## 2022-02-01 DIAGNOSIS — F411 Generalized anxiety disorder: Secondary | ICD-10-CM | POA: Diagnosis not present

## 2022-02-01 DIAGNOSIS — G4700 Insomnia, unspecified: Secondary | ICD-10-CM

## 2022-02-01 MED ORDER — BUPROPION HCL ER (XL) 150 MG PO TB24: 150.0000 mg | ORAL_TABLET | Freq: Every day | ORAL | 1 refills | Status: DC

## 2022-02-01 MED ORDER — ALPRAZOLAM 0.5 MG PO TABS: 0.5000 mg | ORAL_TABLET | Freq: Two times a day (BID) | ORAL | 5 refills | Status: DC | PRN

## 2022-02-01 NOTE — Progress Notes (Signed)
[facility] ? ?Patient ID: Shawna Harper,  ?MRN: [id number] ? ?PCP: Lorenda Hatchet, FNP ? ?Date of Evaluation: 02/01/2022 ?Time spent:20 minutes ? ?Chief Complaint:  ?Chief Complaint   ?Anxiety; Depression; Insomnia; Follow-up ?  ? ? ?HISTORY/CURRENT STATUS: ?HPI For routine med check. ? ?Stressors are an 8 month puppy, is on a 4 week sabatical from work.  This is going into her second week.  States she is still depressed, has no motivation to do anything.  When she found out she was going to be on sabbatical, she made plans to do all sorts of things to catch up at home or the like but she has not done any of it so far.  She just sits on the couch or takes her dog out.  That is all.  Does not really enjoy much of anything.  She has not been missing work states that is not something she would do.  She does not cry easily.  ADLs and personal hygiene are normal.  Appetite has decreased and she has been losing weight, on purpose.  No suicidal or homicidal thoughts. ? ?Anxiety is controlled for the most part.  She does take the Xanax occasionally but not very often.  She does not like to take it.  She sleeps pretty good with the trazodone but does not feel rested when she gets up. ? ?Has been working from home since [place] hit.  She would like to continue working from home due to the anxiety of being around other people.  She requested a note for that. ? ?Patient denies increased energy with decreased need for sleep, no increased talkativeness, no racing thoughts, no impulsivity or risky behaviors, no increased spending, no increased libido, no grandiosity, no increased irritability or anger, and no hallucinations. ? ?Review of Systems  ?Constitutional:  Positive for malaise/fatigue.  ?HENT: Negative.    ?Eyes: Negative.   ?Respiratory: Negative.    ?Cardiovascular: Negative.   ?Gastrointestinal: Negative.   ?Genitourinary: Negative.   ?Musculoskeletal: Negative.   ?Skin: Negative.   ?Neurological:  Negative.   ?Endo/Heme/Allergies: Negative.   ?Psychiatric/Behavioral:    ?     See HPI  ? ? ?Individual Medical History/ Review of Systems: Changes? :No      ? ?Past medications for mental health diagnoses include: ?Zoloft, which caused increased anxiety ? ?Allergies: Vicodin [hydrocodone-acetaminophen] ? ?Current Medications:  ?Current Outpatient Medications:  ?  buPROPion (WELLBUTRIN XL) 150 MG 24 hr tablet, Take 1 tablet (150 mg total) by mouth daily., Disp: 30 tablet, Rfl: 1 ?  citalopram (CELEXA) 40 MG tablet, TAKE 1 TABLET BY MOUTH EVERY DAY, Disp: 90 tablet, Rfl: 1 ?  traZODone (DESYREL) 50 MG tablet, TAKE 1 TO 2 TABLETS BY MOUTH AT BEDTIME AS NEEDED FOR SLEEP, Disp: 180 tablet, Rfl: 1 ?  ALPRAZolam (XANAX) 0.5 MG tablet, Take 1 tablet (0.5 mg total) by mouth 2 (two) times daily as needed for anxiety., Disp: 30 tablet, Rfl: 5 ?  lisinopril (PRINIVIL,ZESTRIL) 10 MG tablet, Take 10 mg by mouth daily. (Patient not taking: Reported on 02/01/2022), Disp: , Rfl:  ?  omeprazole (PRILOSEC) 20 MG capsule, SMARTSIG:1 Capsule(s) By Mouth Morning-Night (Patient not taking: Reported on 02/01/2022), Disp: , Rfl:  ?Medication Side Effects: none ? ?Family Medical/ Social History: Changes? Got a puppy, "he's the monster from hell." ? ?MENTAL HEALTH EXAM: ? ?There were no vitals taken for this visit.There is no height or weight on file to calculate BMI.  ?General Appearance: Casual, Neat,  Well Groomed and Obese  ?Eye Contact:  Good  ?Speech:  Clear and Coherent  ?Volume:  Normal  ?Mood:  Euthymic  ?Affect:  Appropriate  ?Thought Process:  Goal Directed and Descriptions of Associations: Circumstantial  ?Orientation:  Full (Time, Place, and Person)  ?Thought Content: Logical   ?Suicidal Thoughts:  No  ?Homicidal Thoughts:  No  ?Memory:  WNL  ?Judgement:  Good  ?Insight:  Good  ?Psychomotor Activity:  Normal  ?Concentration:  Concentration: Good and Attention Span: Good  ?Recall:  Good  ?Fund of Knowledge: Good  ?Language: Good   ?Assets:  Desire for Improvement ?Financial Resources/Insurance ?Housing ?Transportation ?Vocational/Educational  ?ADL's:  Intact  ?Cognition: WNL  ?Prognosis:  Good  ? ? ?DIAGNOSES:  ?  ICD-10-CM   ?1. Recurrent major depressive disorder, in partial remission ([place])  F33.41   ?  ?2. GAD (generalized anxiety disorder)  F41.1   ?  ?3. Insomnia, unspecified type  G47.00   ?  ? ? ? ?Receiving Psychotherapy: No  ? ? ?RECOMMENDATIONS:  ?PDMP was reviewed.  Last Xanax was filled 10/12/2021.   ?I provided 20 minutes of face to face time during this encounter, including time spent before and after the visit in records review, medical decision making, counseling pertinent to today's visit, and charting.  ?Discussed adding Wellbutrin.  Benefits, risks, side effects were discussed and she would like to try it. This med can also decrease cravings for nicotine and maybe help her quit smoking. ?For now, will leave Celexa on board, but will disc weaning off at the next visit. She doesn't feel like it's ever helped much.  ?Note written out on prescription pad stating "Ms. Salas needs to continue working from home due to severe anxiety and major depressive disorder.  Effective today until 02/01/2023."  This will be scanned in her chart as well. ? ?Continue Xanax 0.5 mg twice daily as needed. ?Start Wellbutrin XL 150 mg, 1 q am. ?Continue Celexa 40 mg daily.   ?Continue trazodone 50 mg, 1-2 nightly as needed sleep. ?Return in 6 weeks. ? ?Donnal Moat, PA-C  ? ? ?

## 2022-02-07 ENCOUNTER — Telehealth: Payer: Self-pay | Admitting: Physician Assistant

## 2022-02-07 NOTE — Telephone Encounter (Signed)
Pt called to say her dog chewed up most of the note you wrote for her to stay out of work.  She wants to know if you will re-write it for her and she can come pick it up.  She can bring the old note in if you need to see it. ? ?Next appt 4/20 ?

## 2022-02-07 NOTE — Telephone Encounter (Signed)
Please see message from patient. I did not see the note in Epic and I asked Otila Kluver and she did not see it either. I called patient to let her know that I would have to wait for your return and she said there is no hurry.  ?

## 2022-02-08 NOTE — Telephone Encounter (Signed)
Noted  

## 2022-02-08 NOTE — Telephone Encounter (Signed)
Patient said she is going to send via e-mail so a copy is sufficient. I left her know it would be available at the front.  ?

## 2022-02-08 NOTE — Telephone Encounter (Signed)
Is the copy sufficient or do you think I should re-write it? Thanks.

## 2022-03-09 ENCOUNTER — Ambulatory Visit: Payer: 59 | Admitting: Physician Assistant

## 2022-03-15 ENCOUNTER — Ambulatory Visit (INDEPENDENT_AMBULATORY_CARE_PROVIDER_SITE_OTHER): Payer: 59 | Admitting: Physician Assistant

## 2022-03-15 ENCOUNTER — Encounter: Payer: Self-pay | Admitting: Physician Assistant

## 2022-03-15 DIAGNOSIS — F3341 Major depressive disorder, recurrent, in partial remission: Secondary | ICD-10-CM

## 2022-03-15 DIAGNOSIS — F411 Generalized anxiety disorder: Secondary | ICD-10-CM

## 2022-03-15 NOTE — Progress Notes (Signed)
[facility] ? ?Patient ID: Samule Ohm,  ?MRN: [id number] ? ?PCP: Lorenda Hatchet, FNP ? ?Date of Evaluation: 03/15/2022 ?Time spent:20 minutes ? ?Chief Complaint:  ?Chief Complaint   ?Anxiety; Depression; Follow-up ?  ? ? ? ?HISTORY/CURRENT STATUS: ?HPI For routine med check. ? ?Started Wellbutrin last month.  Is about 50% better than she was.  Not as quick to irritated.  Has a little bit more energy and motivation.  States she still does not want to go out even to the grocery store.  "I know part of this is just me.  I need to make myself do stuff."  She is excited about the summer and the fact that her pool will be opened soon.  Looking forward to that.  Still has challenges with her 62-monthold Shepherd lab mix puppy but he does seem to be behaving more at least inside.  Work is going well.  She is still working from home and thankful for that.  ADLs and personal hygiene are normal.  She sleeps well.  Has not taken trazodone in many months.  Does still have anxiety at times and has to take the Xanax.  It is still effective.  Not having panic attacks but more of a generalized sense of unease.  Not crying easily.  Denies suicidal or homicidal thoughts. ? ?Patient denies increased energy with decreased need for sleep, no increased talkativeness, no racing thoughts, no impulsivity or risky behaviors, no increased spending, no increased libido, no grandiosity, no increased irritability or anger, and no hallucinations. ? ?Denies dizziness, syncope, seizures, numbness, tingling, tremor, tics, unsteady gait, slurred speech, confusion. Denies muscle or joint pain, stiffness, or dystonia. ? ?Individual Medical History/ Review of Systems: Changes? :No      ? ?Past medications for mental health diagnoses include: ?Zoloft which caused increased anxiety, Celexa quit working after many years, Wellbutrin, Xanax, trazodone ? ?Allergies: Vicodin [hydrocodone-acetaminophen] ? ?Current Medications:  ?Current  Outpatient Medications:  ?  ALPRAZolam (XANAX) 0.5 MG tablet, Take 1 tablet (0.5 mg total) by mouth 2 (two) times daily as needed for anxiety., Disp: 30 tablet, Rfl: 5 ?  buPROPion (WELLBUTRIN XL) 150 MG 24 hr tablet, Take 1 tablet (150 mg total) by mouth daily., Disp: 30 tablet, Rfl: 1 ?  citalopram (CELEXA) 40 MG tablet, TAKE 1 TABLET BY MOUTH EVERY DAY, Disp: 90 tablet, Rfl: 1 ?  traZODone (DESYREL) 50 MG tablet, TAKE 1 TO 2 TABLETS BY MOUTH AT BEDTIME AS NEEDED FOR SLEEP, Disp: 180 tablet, Rfl: 1 ?  lisinopril (PRINIVIL,ZESTRIL) 10 MG tablet, Take 10 mg by mouth daily. (Patient not taking: Reported on 02/01/2022), Disp: , Rfl:  ?  omeprazole (PRILOSEC) 20 MG capsule, SMARTSIG:1 Capsule(s) By Mouth Morning-Night (Patient not taking: Reported on 02/01/2022), Disp: , Rfl:  ?Medication Side Effects: none ? ?Family Medical/ Social History: Changes? Got accommodations approved, will be working from home for another year. ? ?MENTAL HEALTH EXAM: ? ?There were no vitals taken for this visit.There is no height or weight on file to calculate BMI.  ?General Appearance: Casual, Neat, Well Groomed and Obese  ?Eye Contact:  Good  ?Speech:  Clear and Coherent  ?Volume:  Normal  ?Mood:  Euthymic  ?Affect:  Appropriate  ?Thought Process:  Goal Directed and Descriptions of Associations: Circumstantial  ?Orientation:  Full (Time, Place, and Person)  ?Thought Content: Logical   ?Suicidal Thoughts:  No  ?Homicidal Thoughts:  No  ?Memory:  WNL  ?Judgement:  Good  ?Insight:  Good  ?  Psychomotor Activity:  Normal  ?Concentration:  Concentration: Good and Attention Span: Good  ?Recall:  Good  ?Fund of Knowledge: Good  ?Language: Good  ?Assets:  Desire for Improvement ?Financial Resources/Insurance ?Housing ?Vocational/Educational  ?ADL's:  Intact  ?Cognition: WNL  ?Prognosis:  Good  ? ? ?DIAGNOSES:  ?  ICD-10-CM   ?1. Recurrent major depressive disorder, in partial remission ([place])  F33.41   ?  ?2. GAD (generalized anxiety disorder)  F41.1    ?  ? ? ? ?Receiving Psychotherapy: No  ? ? ?RECOMMENDATIONS:  ?PDMP was reviewed.  Last Xanax was filled 03/02/2022. ?I provided 20 minutes of face to face time during this encounter, including time spent before and after the visit in records review, medical decision making, counseling pertinent to today's visit, and charting.  ?I am glad to see her doing better!  Since she has not been on the Wellbutrin for 6 weeks we agreed to leave the dose the same for now.  In 2 or 3 weeks if she is about the same as she is now then we can increase the dose.  She will call and let me know either way because she will need a refill on the Wellbutrin. ?We had discussed discontinuing the Celexa since she has been on it for years and it seemed to have quit working.  She would like to go off it.  I will decrease very slowly. ? ?Wean off Celexa by taking 30 mg daily for 2 weeks then 20 mg daily for 2 weeks then 10 mg daily for 2 weeks and then stop.  She understands the directions and will cut the 40 mg pills to equal these doses.  Call if she has any questions or problems. ?Continue Xanax 0.5 mg twice daily as needed. ?Continue Wellbutrin XL 150 mg, 1 q am. ?Continue trazodone 50 mg, 1-2 nightly as needed sleep. Hasn't taken in months and states she has a large supply. ?Return in 3 months. ? ?Donnal Moat, PA-C  ? ? ?

## 2022-03-28 ENCOUNTER — Telehealth: Payer: Self-pay | Admitting: Physician Assistant

## 2022-03-28 NOTE — Telephone Encounter (Signed)
Pt called reporting weaning off Celexa and started Wellbutrin about 4-6 weeks ago. Noticed in the last week Wellbutrin makes her very jittery and heart flutter. Pt contact # [phone] next apt 7/26 ?

## 2022-03-29 NOTE — Telephone Encounter (Signed)
Patient said the only change has been weaning off Celexa and starting the Wellbutrin. She said she is taking Wellbutrin in the morning, about an hour before she has eaten. Denied change in caffeine or any OTC stimulant-type meds. She said sx occur at rest. No increased stress.  ?

## 2022-03-29 NOTE — Telephone Encounter (Signed)
She has been on the Wellbutrin for almost 2 months now, did not report the symptoms at our last visit a few weeks ago.  Please check and see if anything else has changed.  As she increased caffeine or had to take any stimulating drugs over-the-counter like Sudafed or something.  The Wellbutrin has helped her mood so I do not want to get her off of it if it is not the cause of the jitteriness and palpitations.  Thank you.

## 2022-03-29 NOTE — Telephone Encounter (Signed)
Does she feel that the Wellbutrin is what is causing the symptoms?  I am not sure because she did not report them at the visit a few weeks ago.  Does she want to get off the Wellbutrin even though she reported at least a 50% improvement in mood?  Does she want to go back on the Celexa or try a different antidepressant?  I do recommend she see her PCP to make sure nothing else is going on, no matter what we decide to do about the antidepressant.  Thanks.

## 2022-03-29 NOTE — Telephone Encounter (Signed)
Pharmacy is [facility] on [address] in [place].  ?

## 2022-03-30 ENCOUNTER — Other Ambulatory Visit: Payer: Self-pay | Admitting: Physician Assistant

## 2022-03-30 MED ORDER — CITALOPRAM HYDROBROMIDE 20 MG PO TABS: ORAL_TABLET | ORAL | 1 refills | Status: DC

## 2022-03-30 NOTE — Telephone Encounter (Signed)
Prescription for Celexa 20 mg, sent.  She will take 1/2 pill daily for 2 weeks and then increase to 20 mg daily.  She was on 40 mg daily until 6 or 8 weeks ago when she was weaned off.  Therefore I am not starting low and staying there long. ?

## 2022-03-30 NOTE — Telephone Encounter (Signed)
Patient notified of the recommendations/options. She said she would like to go back on the Celexa. Pharmacy is [facility] on [address] in [place].  ?

## 2022-06-14 ENCOUNTER — Ambulatory Visit: Payer: 59 | Admitting: Physician Assistant

## 2022-07-10 ENCOUNTER — Encounter: Payer: Self-pay | Admitting: Physician Assistant

## 2022-07-10 ENCOUNTER — Ambulatory Visit: Payer: 59 | Admitting: Physician Assistant

## 2022-07-10 DIAGNOSIS — G4700 Insomnia, unspecified: Secondary | ICD-10-CM

## 2022-07-10 DIAGNOSIS — F411 Generalized anxiety disorder: Secondary | ICD-10-CM

## 2022-07-10 DIAGNOSIS — F3341 Major depressive disorder, recurrent, in partial remission: Secondary | ICD-10-CM

## 2022-07-10 MED ORDER — CITALOPRAM HYDROBROMIDE 20 MG PO TABS: 20.0000 mg | ORAL_TABLET | Freq: Every day | ORAL | 1 refills | Status: DC

## 2022-07-10 MED ORDER — ALPRAZOLAM 0.5 MG PO TABS: 0.5000 mg | ORAL_TABLET | Freq: Two times a day (BID) | ORAL | 5 refills | Status: DC | PRN

## 2022-07-10 NOTE — Progress Notes (Signed)
[facility]  Patient ID: Shawna Harper,  MRN: [id number]  PCP: Lorenda Hatchet, FNP  Date of Evaluation: 07/10/2022 Time spent:20 minutes  Chief Complaint:  Chief Complaint   Anxiety; Depression; Follow-up    HISTORY/CURRENT STATUS: HPI For routine med check.  Wasn't able to tolerate the Wellbutrin so we weaned off that and went back on Celexa.  She had a lot of agitation and irritability with the Wellbutrin.  She is doing well being back on the Celexa.  Does not feel like a "ball of nerves" all the time.  She does not take Xanax for anxiety but only for sleep a few times a week.  It helps her have a restful night when she is unable to fall asleep because of ruminating thoughts.  Patient is able to enjoy things.  Energy and motivation are good.  Work is going well.  Fortunately she is still working from home.  The anxiety was extremely heightened when she was having to work in the office.  Her productivity is even better at home than in the office setting.  No extreme sadness, tearfulness, or feelings of hopelessness.  ADLs and personal hygiene are normal.   Denies any changes in concentration, making decisions, or remembering things.  Appetite has not changed.  Weight is stable.  Denies suicidal or homicidal thoughts.  Patient denies increased energy with decreased need for sleep, increased talkativeness, racing thoughts, impulsivity or risky behaviors, increased spending, increased libido, grandiosity, increased irritability or anger, paranoia, and no hallucinations.  Denies dizziness, syncope, seizures, numbness, tingling, tremor, tics, unsteady gait, slurred speech, confusion. Denies muscle or joint pain, stiffness, or dystonia.  Individual Medical History/ Review of Systems: Changes? :Yes      broke left wrist in June, didn't need surgery.  Past medications for mental health diagnoses include: Zoloft which caused increased anxiety, Celexa quit working after many  years, Wellbutrin caused agitation and racing thoughts, Xanax, trazodone  Allergies: Vicodin [hydrocodone-acetaminophen]  Current Medications:  Current Outpatient Medications:    ALPRAZolam (XANAX) 0.5 MG tablet, Take 1 tablet (0.5 mg total) by mouth 2 (two) times daily as needed for anxiety., Disp: 30 tablet, Rfl: 5   citalopram (CELEXA) 20 MG tablet, Take 1 tablet (20 mg total) by mouth daily., Disp: 90 tablet, Rfl: 1   lisinopril (PRINIVIL,ZESTRIL) 10 MG tablet, Take 10 mg by mouth daily. (Patient not taking: Reported on 02/01/2022), Disp: , Rfl:    omeprazole (PRILOSEC) 20 MG capsule, SMARTSIG:1 Capsule(s) By Mouth Morning-Night (Patient not taking: Reported on 02/01/2022), Disp: , Rfl:    traZODone (DESYREL) 50 MG tablet, TAKE 1 TO 2 TABLETS BY MOUTH AT BEDTIME AS NEEDED FOR SLEEP (Patient not taking: Reported on 07/10/2022), Disp: 180 tablet, Rfl: 1 Medication Side Effects: none  Family Medical/ Social History: Changes?  Still working from home.  At least until March.  MENTAL HEALTH EXAM:  There were no vitals taken for this visit.There is no height or weight on file to calculate BMI.  General Appearance: Casual, Neat, Well Groomed and Obese  Eye Contact:  Good  Speech:  Clear and Coherent  Volume:  Normal  Mood:  Euthymic  Affect:  Appropriate  Thought Process:  Goal Directed and Descriptions of Associations: Circumstantial  Orientation:  Full (Time, Place, and Person)  Thought Content: Logical   Suicidal Thoughts:  No  Homicidal Thoughts:  No  Memory:  WNL  Judgement:  Good  Insight:  Good  Psychomotor Activity:  Normal  Concentration:  Concentration:  Good and Attention Span: Good  Recall:  Good  Fund of Knowledge: Good  Language: Good  Assets:  Desire for Improvement Financial Resources/Insurance Housing Transportation Vocational/Educational  ADL's:  Intact  Cognition: WNL  Prognosis:  Good    DIAGNOSES:    ICD-10-CM   1. Recurrent major depressive disorder, in  partial remission ([place])  F33.41     2. GAD (generalized anxiety disorder)  F41.1     3. Insomnia, unspecified type  G47.00       Receiving Psychotherapy: No   RECOMMENDATIONS:  PDMP was reviewed.  Last Xanax was filled 06/13/2022.   I provided 20 minutes of face to face time during this encounter, including time spent before and after the visit in records review, medical decision making, counseling pertinent to today's visit, and charting.   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  She knows she will be working from home at least until March, and we will reassess at her next visit  Continue Xanax 0.5 mg twice daily as needed. Continue Celexa 20 mg, 1 p.o. daily. Return in 3 months.  Donnal Moat, PA-C

## 2023-01-10 ENCOUNTER — Ambulatory Visit (INDEPENDENT_AMBULATORY_CARE_PROVIDER_SITE_OTHER): Payer: 59 | Admitting: Physician Assistant

## 2023-01-10 ENCOUNTER — Encounter: Payer: Self-pay | Admitting: Physician Assistant

## 2023-01-10 DIAGNOSIS — F411 Generalized anxiety disorder: Secondary | ICD-10-CM

## 2023-01-10 DIAGNOSIS — F17200 Nicotine dependence, unspecified, uncomplicated: Secondary | ICD-10-CM

## 2023-01-10 DIAGNOSIS — F3341 Major depressive disorder, recurrent, in partial remission: Secondary | ICD-10-CM

## 2023-01-10 MED ORDER — ALPRAZOLAM 0.5 MG PO TABS: 0.5000 mg | ORAL_TABLET | Freq: Two times a day (BID) | ORAL | 5 refills | Status: DC | PRN

## 2023-01-10 MED ORDER — CITALOPRAM HYDROBROMIDE 20 MG PO TABS: 20.0000 mg | ORAL_TABLET | Freq: Every day | ORAL | 1 refills | Status: DC

## 2023-01-10 NOTE — Progress Notes (Signed)
[facility]  Patient ID: Shawna Harper,  MRN: [id number]  PCP: Lorenda Hatchet, FNP  Date of Evaluation: 01/10/2023 Time spent:20 minutes  Chief Complaint:  Chief Complaint   Anxiety; Depression; Follow-up    HISTORY/CURRENT STATUS: HPI For routine med check.  Doing ok.  Since her last visit she weaned off the Celexa, to see how she did without it.  She was off for about 3 months, she started crying more and felt sad all the time.  She restarted it around 2 weeks ago and is already feeling better.  Work is stressful so that gets her down sometimes.  She sleeps well.  Takes the Xanax in the evening, rarely takes it during the daytime.  Her  mind will not shut off if she does not take the Xanax. Feels rested when she gets up.  She does not really do much of anything except watch TV, and take her dog to the park.  That gives her joy.  She does not have any hobbies.  She does stay in close contact with her family and several good friends.  Is a homebody.  No extreme sadness, tearfulness, or feelings of hopelessness.  ADLs and personal hygiene are normal.   Denies any changes in concentration, making decisions, or remembering things.  Appetite has not changed.  Weight is stable.   Denies suicidal or homicidal thoughts.  Patient denies increased energy with decreased need for sleep, increased talkativeness, racing thoughts, impulsivity or risky behaviors, increased spending, increased libido, grandiosity, increased irritability or anger, paranoia, or hallucinations.  Denies dizziness, syncope, seizures, numbness, tingling, tremor, tics, unsteady gait, slurred speech, confusion. Denies muscle or joint pain, stiffness, or dystonia.  Individual Medical History/ Review of Systems: Changes? :No       Past medications for mental health diagnoses include: Zoloft which caused increased anxiety, Celexa quit working after many years, Wellbutrin caused agitation and racing thoughts, Xanax,  trazodone  Allergies: Vicodin [hydrocodone-acetaminophen]  Current Medications:  Current Outpatient Medications:    ALPRAZolam (XANAX) 0.5 MG tablet, Take 1 tablet (0.5 mg total) by mouth 2 (two) times daily as needed for anxiety., Disp: 30 tablet, Rfl: 5   citalopram (CELEXA) 20 MG tablet, Take 1 tablet (20 mg total) by mouth daily., Disp: 90 tablet, Rfl: 1   lisinopril (PRINIVIL,ZESTRIL) 10 MG tablet, Take 10 mg by mouth daily. (Patient not taking: Reported on 02/01/2022), Disp: , Rfl:    omeprazole (PRILOSEC) 20 MG capsule, SMARTSIG:1 Capsule(s) By Mouth Morning-Night (Patient not taking: Reported on 02/01/2022), Disp: , Rfl:  Medication Side Effects: none  Family Medical/ Social History: Changes?  Still working from home.   MENTAL HEALTH EXAM:  There were no vitals taken for this visit.There is no height or weight on file to calculate BMI.  General Appearance: Casual, Neat, Well Groomed and Obese  Eye Contact:  Good  Speech:  Clear and Coherent  Volume:  Normal  Mood:  Euthymic  Affect:  Appropriate  Thought Process:  Goal Directed and Descriptions of Associations: Circumstantial  Orientation:  Full (Time, Place, and Person)  Thought Content: Logical   Suicidal Thoughts:  No  Homicidal Thoughts:  No  Memory:  WNL  Judgement:  Good  Insight:  Good  Psychomotor Activity:  Normal  Concentration:  Concentration: Good and Attention Span: Good  Recall:  Good  Fund of Knowledge: Good  Language: Good  Assets:  Desire for Improvement Financial Resources/Insurance Housing Resilience Transportation Vocational/Educational  ADL's:  Intact  Cognition: WNL  Prognosis:  Good   DIAGNOSES:    ICD-10-CM   1. Recurrent major depressive disorder, in partial remission ([place])  F33.41     2. GAD (generalized anxiety disorder)  F41.1     3. Smoker  F17.200      Receiving Psychotherapy: No   RECOMMENDATIONS:  PDMP was reviewed.  Last Xanax was filled 12/20/2022. I provided 20 minutes  of face to face time during this encounter, including time spent before and after the visit in records review, medical decision making, counseling pertinent to today's visit, and charting.   Reminded her not to stop taking any medication without discussing with me first.  She is already feeling better emotionally since she restarted the Celexa a few weeks ago so no changes will be made. Smoking cessation discussed.  She is not ready to quit. Note written saying she needs to continue to work from home.  Continue Xanax 0.5 mg twice daily as needed. Continue Celexa 20 mg, 1 p.o. daily. Return in 6 months.  Donnal Moat, PA-C

## 2023-02-28 ENCOUNTER — Ambulatory Visit (INDEPENDENT_AMBULATORY_CARE_PROVIDER_SITE_OTHER): Payer: 59 | Admitting: Physician Assistant

## 2023-02-28 ENCOUNTER — Encounter: Payer: Self-pay | Admitting: Physician Assistant

## 2023-02-28 DIAGNOSIS — F411 Generalized anxiety disorder: Secondary | ICD-10-CM | POA: Diagnosis not present

## 2023-02-28 DIAGNOSIS — F331 Major depressive disorder, recurrent, moderate: Secondary | ICD-10-CM | POA: Diagnosis not present

## 2023-02-28 MED ORDER — DULOXETINE HCL 60 MG PO CPEP: 60.0000 mg | ORAL_CAPSULE | Freq: Every day | ORAL | 1 refills | Status: DC

## 2023-02-28 MED ORDER — DULOXETINE HCL 30 MG PO CPEP: 30.0000 mg | ORAL_CAPSULE | Freq: Every day | ORAL | 0 refills | Status: DC

## 2023-02-28 NOTE — Progress Notes (Signed)
[facility]  Patient ID: Shawna Harper,  MRN: [id number]  PCP: Adolph Pollack, FNP  Date of Evaluation: 02/28/2023 Time spent:20 minutes  Chief Complaint:  Chief Complaint   Depression    HISTORY/CURRENT STATUS: HPI For routine med check.  She stopped the Celexa about 45 days ago, it wasn't working, after she'd been taking it for 2-3 months. Cries all the time, for no reason.  Sleeps a lot. The only fun she has is walking her dog. Work is stressful but ok, is not missing days.  ADLs and personal hygiene are normal.  Appetite is normal and weight is stable.  No suicidal or homicidal thoughts.  She does get anxious, not severely though and Xanax is helpful when needed.  Not having panic attacks usually but gets overwhelmed, feels like something bad is going to happen.  Patient denies increased energy with decreased need for sleep, increased talkativeness, racing thoughts, impulsivity or risky behaviors, increased spending, increased libido, grandiosity, increased irritability or anger, paranoia, or hallucinations.  Denies dizziness, syncope, seizures, numbness, tingling, tremor, tics, unsteady gait, slurred speech, confusion. Denies muscle or joint pain, stiffness, or dystonia.  Individual Medical History/ Review of Systems: Changes? :No       Past medications for mental health diagnoses include: Zoloft which caused increased anxiety, Celexa quit working after many years, Wellbutrin caused agitation and racing thoughts, Xanax, trazodone  Allergies: Vicodin [hydrocodone-acetaminophen]  Current Medications:  Current Outpatient Medications:    ALPRAZolam (XANAX) 0.5 MG tablet, Take 1 tablet (0.5 mg total) by mouth 2 (two) times daily as needed for anxiety., Disp: 30 tablet, Rfl: 5   DULoxetine (CYMBALTA) 30 MG capsule, Take 1 capsule (30 mg total) by mouth daily., Disp: 14 capsule, Rfl: 0   DULoxetine (CYMBALTA) 60 MG capsule, Take 1 capsule (60 mg total) by mouth  daily. Start after the 2 weeks of 30 mg., Disp: 30 capsule, Rfl: 1   lisinopril (PRINIVIL,ZESTRIL) 10 MG tablet, Take 10 mg by mouth daily. (Patient not taking: Reported on 02/01/2022), Disp: , Rfl:    omeprazole (PRILOSEC) 20 MG capsule, SMARTSIG:1 Capsule(s) By Mouth Morning-Night (Patient not taking: Reported on 02/01/2022), Disp: , Rfl:  Medication Side Effects: none  Family Medical/ Social History: Changes?  Still working from home.   MENTAL HEALTH EXAM:  There were no vitals taken for this visit.There is no height or weight on file to calculate BMI.  General Appearance: Casual, Neat, Well Groomed and Obese  Eye Contact:  Good  Speech:  Clear and Coherent  Volume:  Normal  Mood:  Depressed  Affect:  Depressed and Tearful  Thought Process:  Goal Directed and Descriptions of Associations: Circumstantial  Orientation:  Full (Time, Place, and Person)  Thought Content: Logical   Suicidal Thoughts:  No  Homicidal Thoughts:  No  Memory:  WNL  Judgement:  Good  Insight:  Good  Psychomotor Activity:  Normal  Concentration:  Concentration: Good and Attention Span: Good  Recall:  Good  Fund of Knowledge: Good  Language: Good  Assets:  Desire for Improvement Financial Resources/Insurance Housing Resilience Transportation Vocational/Educational  ADL's:  Intact  Cognition: WNL  Prognosis:  Good   DIAGNOSES:    ICD-10-CM   1. Major depressive disorder, recurrent episode, moderate  F33.1     2. GAD (generalized anxiety disorder)  F41.1      Receiving Psychotherapy: No   RECOMMENDATIONS:  PDMP was reviewed.  Last Xanax was filled 01/10/2023. I provided 20 minutes of face to face  time during this encounter, including time spent before and after the visit in records review, medical decision making, counseling pertinent to today's visit, and charting.   We discussed the depression.  Celexa worked years ago but has not worked in the recent past so she already stopped it.  I recommend  using an SNRI.  Benefits, risk, and side effects were discussed and she would like to try 1.  I specifically recommend Cymbalta.  Continue Xanax 0.5 mg twice daily as needed. Start Cymbalta 30 mg, 1 p.o. daily for 2 weeks and then increase to 60 mg daily. Recommend counseling. Return in 6 weeks.  Melony Overly, PA-C

## 2023-04-11 ENCOUNTER — Encounter: Payer: Self-pay | Admitting: Physician Assistant

## 2023-04-11 ENCOUNTER — Ambulatory Visit (INDEPENDENT_AMBULATORY_CARE_PROVIDER_SITE_OTHER): Payer: 59 | Admitting: Physician Assistant

## 2023-04-11 DIAGNOSIS — F411 Generalized anxiety disorder: Secondary | ICD-10-CM | POA: Diagnosis not present

## 2023-04-11 DIAGNOSIS — Z566 Other physical and mental strain related to work: Secondary | ICD-10-CM

## 2023-04-11 DIAGNOSIS — G4700 Insomnia, unspecified: Secondary | ICD-10-CM | POA: Diagnosis not present

## 2023-04-11 DIAGNOSIS — F3341 Major depressive disorder, recurrent, in partial remission: Secondary | ICD-10-CM | POA: Diagnosis not present

## 2023-04-11 DIAGNOSIS — F17200 Nicotine dependence, unspecified, uncomplicated: Secondary | ICD-10-CM

## 2023-04-11 DIAGNOSIS — F4010 Social phobia, unspecified: Secondary | ICD-10-CM

## 2023-04-11 MED ORDER — DULOXETINE HCL 60 MG PO CPEP
60.0000 mg | ORAL_CAPSULE | Freq: Every day | ORAL | 1 refills | Status: AC
Start: 2023-04-11 — End: ?

## 2023-04-11 NOTE — Progress Notes (Signed)
[facility]  Patient ID: Shawna Harper,  MRN: [id number]  PCP: Adolph Pollack, FNP  Date of Evaluation: 04/11/2023 Time spent:30 minutes  Chief Complaint:  Chief Complaint   Anxiety; Depression; Follow-up    HISTORY/CURRENT STATUS: HPI For routine med check.  Doing better w/ sadness and crying since being on Cymbalta.  Still feels kind of down though. Patient is able to enjoy things sometimes but is tired and work is so stressful she doesn't have the energy to do anything outside of work. It's very mentally taxing, is under a lot of pressure all the time. Not sleeping well d/t stress, has racing thoughts and she cannot relax to fall asleep.  Then she wakes up multiple times each night.  ADLs and personal hygiene are normal.  Focus and attention are decreased depending on the amount of stress she's under at work.  Appetite has not changed.  Weight is stable.   Denies suicidal or homicidal thoughts.  Anxiety is still a big problem. The Xanax helps but she would prefer not to need it at all.  She will get panicky and feels overwhelmed a lot.  She is unable to relax on Sundays because she knows she has to go back to that stressful job again the next day.  She sometimes gets short of breath or feels palpitations, her stomach gets upset due to the anxiety.  When she is that way nothing helps, it just has to run its course.  She really wants to quit smoking.  She is only smoking about 1/2 pack/day, in the past, she has tried Chantix and NicoDerm patches without any luck.  She has not tried Nicorette gum because she cannot chew gum due to an issue with her throat.   Patient denies increased energy with decreased need for sleep, increased talkativeness, racing thoughts, impulsivity or risky behaviors, increased spending, increased libido, grandiosity, increased irritability or anger, paranoia, or hallucinations.  Denies dizziness, syncope, seizures, numbness, tingling, tremor, tics,  unsteady gait, slurred speech, confusion. Denies muscle or joint pain, stiffness, or dystonia.  Individual Medical History/ Review of Systems: Changes? :No       Past medications for mental health diagnoses include: Zoloft which caused increased anxiety, Celexa quit working after many years, Wellbutrin caused agitation and racing thoughts, Xanax, trazodone  Allergies: Vicodin [hydrocodone-acetaminophen]  Current Medications:  Current Outpatient Medications:    ALPRAZolam (XANAX) 0.5 MG tablet, Take 1 tablet (0.5 mg total) by mouth 2 (two) times daily as needed for anxiety., Disp: 30 tablet, Rfl: 5   DULoxetine (CYMBALTA) 60 MG capsule, Take 1 capsule (60 mg total) by mouth daily., Disp: 90 capsule, Rfl: 1   lisinopril (PRINIVIL,ZESTRIL) 10 MG tablet, Take 10 mg by mouth daily. (Patient not taking: Reported on 02/01/2022), Disp: , Rfl:    omeprazole (PRILOSEC) 20 MG capsule, SMARTSIG:1 Capsule(s) By Mouth Morning-Night (Patient not taking: Reported on 02/01/2022), Disp: , Rfl:  Medication Side Effects: none  Family Medical/ Social History: Changes?  none  MENTAL HEALTH EXAM:  There were no vitals taken for this visit.There is no height or weight on file to calculate BMI.  General Appearance: Casual, Neat, Well Groomed and Obese  Eye Contact:  Good  Speech:  Clear and Coherent  Volume:  Normal  Mood:  Anxious  Affect:  Anxious  Thought Process:  Goal Directed and Descriptions of Associations: Circumstantial  Orientation:  Full (Time, Place, and Person)  Thought Content: Logical   Suicidal Thoughts:  No  Homicidal Thoughts:  No  Memory:  WNL  Judgement:  Good  Insight:  Good  Psychomotor Activity:  Normal  Concentration:  Concentration: Good and Attention Span: Good  Recall:  Good  Fund of Knowledge: Good  Language: Good  Assets:  Desire for Improvement Financial Resources/Insurance Housing Resilience Transportation Vocational/Educational  ADL's:  Intact  Cognition: WNL   Prognosis:  Good   DIAGNOSES:    ICD-10-CM   1. GAD (generalized anxiety disorder)  F41.1     2. Recurrent major depressive disorder, in partial remission (HCC)  F33.41     3. Social anxiety disorder  F40.10     4. Insomnia, unspecified type  G47.00     5. Smoker  F17.200     6. Stress at work  Z56.6      Receiving Psychotherapy: No   RECOMMENDATIONS:  PDMP was reviewed.  Last Xanax was filled 03/17/2023. I provided 30 minutes of face to face time during this encounter, including time spent before and after the visit in records review, medical decision making, counseling pertinent to today's visit, and charting.   She is under a tremendous amount of stress which is affecting her physical health. Recommend she take a leave of absence to work on her mental health.  She wants to quit smoking and being off work will give her more of an opportunity to do that.  Recommend she begin short-term disability starting tomorrow, 04/12/2023-out until 05/27/2023, go back on 05/28/2023.  Discussed smoking cessation.  She had just tried Chantix in the past but it was not effective.  She took Wellbutrin which caused racing thoughts and agitation.  Also tried NicoDerm patches which did not work.  I recommend she try the NicoDerm patches again but start at 21 mg of nicotine daily for a couple of weeks, stop smoking on day 1.  Decrease to 14 mg patches in 2 to 4 weeks, use that for another 2 to 4 weeks and then decrease to 7 mg.  Then if she has a stressful event or something that is causing a panic attack then she can supplement it with a nicotine lozenge or Nicorette gum.  Hopefully with time she will not need either the NicoDerm or Nicorette or nicotine lozenge.  Ideally she will not be on any nicotine at all, the goal would be in 3 to 6 months.  This is a more successful way of smoking cessation, it gives the patient a little control over when they can have a "hit" of nicotine if occasionally needed.  She  verbalizes understanding and will try this.  Start NicoDerm patches 21 mg for 2 to 4 weeks then 14 mg for 2 to 4 weeks, then 7 mg for 2 to 4 weeks and then stop. Use Nicorette gum or nicotine lozenges as needed as per directions on the package. Continue Xanax 0.5 mg twice daily as needed. Continue Cymbalta 60 mg daily. Recommend counseling. Return in 4-5 weeks.  Melony Overly, PA-C

## 2023-04-20 ENCOUNTER — Telehealth: Payer: Self-pay | Admitting: Physician Assistant

## 2023-04-20 NOTE — Telephone Encounter (Signed)
Received Health Care Provider Statement. Given to Traci 5/31 Patient needs forms sent by 6/8

## 2023-04-24 NOTE — Telephone Encounter (Signed)
Working on paperwork 

## 2023-04-27 NOTE — Telephone Encounter (Signed)
Pt called at 11:30a.  She said she has faxed to [place] a release of info if that is needed.  I didn't see it on the fax.

## 2023-04-30 DIAGNOSIS — Z0289 Encounter for other administrative examinations: Secondary | ICD-10-CM

## 2023-04-30 NOTE — Telephone Encounter (Signed)
Rosey Bath is reviewing paper work to sign

## 2023-04-30 NOTE — Telephone Encounter (Signed)
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

## 2023-05-03 ENCOUNTER — Telehealth: Payer: Self-pay | Admitting: Physician Assistant

## 2023-05-03 NOTE — Telephone Encounter (Signed)
Please see message about FMLA paperwork.  

## 2023-05-03 NOTE — Telephone Encounter (Signed)
Megan at [facility] LVM @ 10:55a.  She went

## 2023-05-03 NOTE — Telephone Encounter (Signed)
Megan at [facility] LVM @ 10:55a.  She said they have disability paperwork for the pt, but they need to get more info.  Pls call them back at [phone].  She said you can speak to her or anyone else at [facility].  Next appt 6/26

## 2023-05-04 NOTE — Telephone Encounter (Signed)
Noted will try calling them today.

## 2023-05-10 NOTE — Telephone Encounter (Signed)
[employer] sent 7 questions to be answered on a follow up from last paper work submitted on 04/30/23. Will need to get with Rosey Bath to complete these questions.   Teresa-let me know when to get with you to discuss questions. Paper work due by 05/12/2023, if we can talk today or tomorrow.

## 2023-05-10 NOTE — Telephone Encounter (Signed)
Tomorrow is good

## 2023-05-10 NOTE — Telephone Encounter (Signed)
Noted thanks °

## 2023-05-11 NOTE — Telephone Encounter (Signed)
This has been completed and will be faxed back with responses

## 2023-05-16 ENCOUNTER — Ambulatory Visit (INDEPENDENT_AMBULATORY_CARE_PROVIDER_SITE_OTHER): Payer: 59 | Admitting: Physician Assistant

## 2023-05-21 ENCOUNTER — Encounter: Payer: Self-pay | Admitting: Physician Assistant

## 2023-05-21 ENCOUNTER — Ambulatory Visit (INDEPENDENT_AMBULATORY_CARE_PROVIDER_SITE_OTHER): Payer: 59 | Admitting: Physician Assistant

## 2023-05-21 DIAGNOSIS — F3341 Major depressive disorder, recurrent, in partial remission: Secondary | ICD-10-CM | POA: Diagnosis not present

## 2023-05-21 DIAGNOSIS — F4010 Social phobia, unspecified: Secondary | ICD-10-CM

## 2023-05-21 DIAGNOSIS — F17200 Nicotine dependence, unspecified, uncomplicated: Secondary | ICD-10-CM

## 2023-05-21 DIAGNOSIS — G4700 Insomnia, unspecified: Secondary | ICD-10-CM

## 2023-05-21 DIAGNOSIS — F411 Generalized anxiety disorder: Secondary | ICD-10-CM

## 2023-05-21 NOTE — Progress Notes (Signed)
[facility]  Patient ID: Shawna Harper,  MRN: [id number]  PCP: Adolph Pollack, FNP  Date of Evaluation: 05/21/2023  Time spent: 22 minutes  Chief Complaint:  Chief Complaint   Anxiety; Follow-up    HISTORY/CURRENT STATUS: HPI For routine med check.  Shawna Harper is feeling better with her mental health.  States she is not as irritable as she was.  Not having anxiety as severe either.  No panic attacks.  She is usually only taking the Xanax in the evening to help calm her mind down.  Not having panic attacks but she does feel overwhelmed at times.  This break from work has been good for her.    She has been under so much stress over the past several months that she did not try the NicoDerm patches.  She is still smoking about the same amount.  Patient is able to enjoy things.  Energy and motivation are better.  No extreme sadness, tearfulness, or feelings of hopelessness now.  Sleeps well as long as she takes the Xanax.  If not she ruminates about things and has a hard time relaxing.  ADLs and personal hygiene are normal.   Denies any changes in concentration, making decisions, or remembering things.  Appetite has not changed.  Weight is stable.  Denies suicidal or homicidal thoughts.  Patient denies increased energy with decreased need for sleep, increased talkativeness, racing thoughts, impulsivity or risky behaviors, increased spending, increased libido, grandiosity, increased irritability or anger, paranoia, or hallucinations.  Denies dizziness, syncope, seizures, numbness, tingling, tremor, tics, unsteady gait, slurred speech, confusion. Denies muscle or joint pain, stiffness, or dystonia.  Individual Medical History/ Review of Systems: Changes? :No       Past medications for mental health diagnoses include: Zoloft which caused increased anxiety, Celexa quit working after many years, Wellbutrin caused agitation and racing thoughts, Xanax, trazodone  Allergies: Vicodin  [hydrocodone-acetaminophen]  Current Medications:  Current Outpatient Medications:    ALPRAZolam (XANAX) 0.5 MG tablet, Take 1 tablet (0.5 mg total) by mouth 2 (two) times daily as needed for anxiety., Disp: 30 tablet, Rfl: 5   DULoxetine (CYMBALTA) 60 MG capsule, Take 1 capsule (60 mg total) by mouth daily., Disp: 90 capsule, Rfl: 1   lisinopril (PRINIVIL,ZESTRIL) 10 MG tablet, Take 10 mg by mouth daily. (Patient not taking: Reported on 02/01/2022), Disp: , Rfl:    omeprazole (PRILOSEC) 20 MG capsule, SMARTSIG:1 Capsule(s) By Mouth Morning-Night (Patient not taking: Reported on 02/01/2022), Disp: , Rfl:  Medication Side Effects: none  Family Medical/ Social History: Changes?  none  MENTAL HEALTH EXAM:  There were no vitals taken for this visit.There is no height or weight on file to calculate BMI.  General Appearance: Casual, Neat, Well Groomed and Obese  Eye Contact:  Good  Speech:  Clear and Coherent  Volume:  Normal  Mood:  Euthymic  Affect:  Congruent  Thought Process:  Goal Directed and Descriptions of Associations: Circumstantial  Orientation:  Full (Time, Place, and Person)  Thought Content: Logical   Suicidal Thoughts:  No  Homicidal Thoughts:  No  Memory:  WNL  Judgement:  Good  Insight:  Good  Psychomotor Activity:  Normal  Concentration:  Concentration: Good and Attention Span: Good  Recall:  Good  Fund of Knowledge: Good  Language: Good  Assets:  Desire for Improvement Financial Resources/Insurance Housing Resilience Transportation Vocational/Educational  ADL's:  Intact  Cognition: WNL  Prognosis:  Good   DIAGNOSES:    ICD-10-CM   1.  GAD (generalized anxiety disorder)  F41.1     2. Social anxiety disorder  F40.10     3. Recurrent major depressive disorder, in partial remission (HCC)  F33.41     4. Insomnia, unspecified type  G47.00     5. Smoker  F17.200       Receiving Psychotherapy: No   RECOMMENDATIONS:  PDMP was reviewed.  Last Xanax was  filled 04/17/2023. I provided 22 minutes of face to face time during this encounter, including time spent before and after the visit in records review, medical decision making, counseling pertinent to today's visit, and charting.   I am glad to see her doing better.  No med changes are needed. She is well enough now to work.  She will be going back on 05/28/2023.  Counseling given again for smoking cessation.  NicoDerm patches, Nicorette gum, or nicotine lozenges were discussed.  States she will try at some point to quit smoking.  Continue Xanax 0.5 mg twice daily as needed. Continue Cymbalta 60 mg daily. Recommend counseling. Return in 3 months.  Melony Overly, PA-C

## 2023-07-11 ENCOUNTER — Ambulatory Visit (INDEPENDENT_AMBULATORY_CARE_PROVIDER_SITE_OTHER): Payer: 59 | Admitting: Physician Assistant

## 2023-08-16 ENCOUNTER — Other Ambulatory Visit: Payer: Self-pay | Admitting: Physician Assistant

## 2023-08-21 ENCOUNTER — Ambulatory Visit (INDEPENDENT_AMBULATORY_CARE_PROVIDER_SITE_OTHER): Payer: 59 | Admitting: Physician Assistant

## 2023-08-21 ENCOUNTER — Encounter: Payer: Self-pay | Admitting: Physician Assistant

## 2023-08-21 DIAGNOSIS — F3341 Major depressive disorder, recurrent, in partial remission: Secondary | ICD-10-CM

## 2023-08-21 DIAGNOSIS — F4010 Social phobia, unspecified: Secondary | ICD-10-CM

## 2023-08-21 DIAGNOSIS — F411 Generalized anxiety disorder: Secondary | ICD-10-CM

## 2023-08-21 DIAGNOSIS — F17200 Nicotine dependence, unspecified, uncomplicated: Secondary | ICD-10-CM

## 2023-08-21 DIAGNOSIS — G4700 Insomnia, unspecified: Secondary | ICD-10-CM | POA: Diagnosis not present

## 2023-08-21 MED ORDER — ALPRAZOLAM 0.5 MG PO TABS: 0.5000 mg | ORAL_TABLET | Freq: Two times a day (BID) | ORAL | 5 refills | Status: DC | PRN

## 2023-08-21 MED ORDER — DULOXETINE HCL 60 MG PO CPEP: 60.0000 mg | ORAL_CAPSULE | Freq: Every day | ORAL | 1 refills | Status: DC

## 2023-08-21 NOTE — Progress Notes (Unsigned)
[facility]  Patient ID: Shawna Harper,  MRN: [id number]  PCP: Adolph Pollack, FNP  Date of Evaluation: 08/21/2023  Time spent:20 minutes  Chief Complaint:  Chief Complaint   Anxiety; Depression; Follow-up    HISTORY/CURRENT STATUS: HPI For routine med check.  Doing well. Work is fine, still working from home and doesn't think she'll ever be able to go back to the office d/t anxiety. Asks for another note stating that, as we've done in the past, requests it be indefinitely. She does her job well when at home.   Patient is able to enjoy things.  Energy and motivation are good.  No extreme sadness, tearfulness, or feelings of hopelessness.  Sleeps well most of the time. ADLs and personal hygiene are normal.   Denies any changes in concentration, making decisions, or remembering things.  Appetite has not changed.  Weight is stable.  Anxiety is well controlled as long as she is in her comfort zone.  That is at home most of the time.  Denies suicidal or homicidal thoughts.  Patient denies increased energy with decreased need for sleep, increased talkativeness, racing thoughts, impulsivity or risky behaviors, increased spending, increased libido, grandiosity, increased irritability or anger, paranoia, or hallucinations.  Denies dizziness, syncope, seizures, numbness, tingling, tremor, tics, unsteady gait, slurred speech, confusion. Denies muscle or joint pain, stiffness, or dystonia.  Individual Medical History/ Review of Systems: Changes? :No       Past medications for mental health diagnoses include: Zoloft which caused increased anxiety, Celexa quit working after many years, Wellbutrin caused agitation and racing thoughts, Xanax, trazodone  Allergies: Vicodin [hydrocodone-acetaminophen]  Current Medications:  Current Outpatient Medications:    ALPRAZolam (XANAX) 0.5 MG tablet, Take 1 tablet (0.5 mg total) by mouth 2 (two) times daily as needed. for anxiety, Disp: 30  tablet, Rfl: 5   DULoxetine (CYMBALTA) 60 MG capsule, Take 1 capsule (60 mg total) by mouth daily., Disp: 90 capsule, Rfl: 1   lisinopril (PRINIVIL,ZESTRIL) 10 MG tablet, Take 10 mg by mouth daily. (Patient not taking: Reported on 02/01/2022), Disp: , Rfl:    omeprazole (PRILOSEC) 20 MG capsule, SMARTSIG:1 Capsule(s) By Mouth Morning-Night (Patient not taking: Reported on 02/01/2022), Disp: , Rfl:  Medication Side Effects: none  Family Medical/ Social History: Changes?  none  MENTAL HEALTH EXAM:  There were no vitals taken for this visit.There is no height or weight on file to calculate BMI.  General Appearance: Casual, Neat, Well Groomed and Obese  Eye Contact:  Good  Speech:  Clear and Coherent  Volume:  Normal  Mood:  Euthymic  Affect:  Congruent  Thought Process:  Goal Directed and Descriptions of Associations: Circumstantial  Orientation:  Full (Time, Place, and Person)  Thought Content: Logical   Suicidal Thoughts:  No  Homicidal Thoughts:  No  Memory:  WNL  Judgement:  Good  Insight:  Good  Psychomotor Activity:  Normal  Concentration:  Concentration: Good and Attention Span: Good  Recall:  Good  Fund of Knowledge: Good  Language: Good  Assets:  Communication Skills Desire for Improvement Financial Resources/Insurance Housing Resilience Transportation Vocational/Educational  ADL's:  Intact  Cognition: WNL  Prognosis:  Good   DIAGNOSES:    ICD-10-CM   1. GAD (generalized anxiety disorder)  F41.1     2. Social anxiety disorder  F40.10     3. Recurrent major depressive disorder, in partial remission (HCC)  F33.41     4. Insomnia, unspecified type  G47.00  5. Smoker  F17.200       Receiving Psychotherapy: No   RECOMMENDATIONS:  PDMP was reviewed.  Last Xanax was filled 07/04/2023. I provided 20 minutes of face to face time during this encounter, including time spent before and after the visit in records review, medical decision making, counseling  pertinent to today's visit, and charting.   Counseling given again for smoking cessation.  Shawna Harper patches, Nicorette gum, or nicotine lozenges were discussed.  States she will try at some point to quit smoking.  Note written for her to work from home indefinitely.  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  See note on chart.  Continue Xanax 0.5 mg twice daily as needed.  Continue Cymbalta 60 mg daily. Recommend counseling. Return in 6 months.  Melony Overly, PA-C

## 2024-02-19 ENCOUNTER — Ambulatory Visit: Payer: 59 | Admitting: Physician Assistant

## 2024-03-27 ENCOUNTER — Other Ambulatory Visit: Payer: Self-pay | Admitting: Physician Assistant

## 2024-04-17 ENCOUNTER — Encounter: Payer: Self-pay | Admitting: Physician Assistant

## 2024-04-17 ENCOUNTER — Ambulatory Visit: Admitting: Physician Assistant

## 2024-04-17 DIAGNOSIS — F4010 Social phobia, unspecified: Secondary | ICD-10-CM | POA: Diagnosis not present

## 2024-04-17 DIAGNOSIS — F3341 Major depressive disorder, recurrent, in partial remission: Secondary | ICD-10-CM | POA: Diagnosis not present

## 2024-04-17 DIAGNOSIS — F17200 Nicotine dependence, unspecified, uncomplicated: Secondary | ICD-10-CM

## 2024-04-17 DIAGNOSIS — F411 Generalized anxiety disorder: Secondary | ICD-10-CM | POA: Diagnosis not present

## 2024-04-17 MED ORDER — ALPRAZOLAM 0.5 MG PO TABS: 0.5000 mg | ORAL_TABLET | Freq: Two times a day (BID) | ORAL | 5 refills | Status: DC | PRN

## 2024-04-17 NOTE — Progress Notes (Signed)
 [facility]  Patient ID: Shawna Harper,  MRN: [id number]  PCP: Diona Franklin, FNP  Date of Evaluation: 04/17/2024  Time spent:20 minutes  Chief Complaint:  Chief Complaint   Depression; Anxiety; Follow-up    HISTORY/CURRENT STATUS: HPI For routine med check.  She's doing well w/ current meds.  Patient is able to enjoy things.  She is looking forward to going to the beach this summer.  Energy and motivation are good.  Work is going well.  She is still working from home and a note will be due in the October stating that it is beneficial for her to continue that.  No extreme sadness, tearfulness, or feelings of hopelessness.  Sleeps well most of the time. ADLs and personal hygiene are normal.   Denies any changes in concentration, making decisions, or remembering things.  Appetite has not changed.  Weight is stable.  Still has anxiety, more of a generalized sense of unease and being overwhelmed when she gets anxious.  Not having panic attacks.  She does take Xanax  occasionally and it is effective when needed.  Denies suicidal or homicidal thoughts.  She is still smoking but does want to quit at some point.  She is not ready right now.  Patient denies increased energy with decreased need for sleep, increased talkativeness, racing thoughts, impulsivity or risky behaviors, increased spending, increased libido, grandiosity, increased irritability or anger, paranoia, or hallucinations.  Denies dizziness, syncope, seizures, numbness, tingling, tremor, tics, unsteady gait, slurred speech, confusion. Denies muscle or joint pain, stiffness, or dystonia.  Individual Medical History/ Review of Systems: Changes? :No       Past medications for mental health diagnoses include: Zoloft which caused increased anxiety, Celexa  quit working after many years, Wellbutrin  caused agitation and racing thoughts, Xanax , trazodone   Allergies: Vicodin [hydrocodone -acetaminophen ]  Current  Medications:  Current Outpatient Medications:    DULoxetine  (CYMBALTA ) 60 MG capsule, TAKE 1 CAPSULE BY MOUTH EVERY DAY, Disp: 90 capsule, Rfl: 0   ALPRAZolam  (XANAX ) 0.5 MG tablet, Take 1 tablet (0.5 mg total) by mouth 2 (two) times daily as needed. for anxiety, Disp: 30 tablet, Rfl: 5   lisinopril (PRINIVIL,ZESTRIL) 10 MG tablet, Take 10 mg by mouth daily. (Patient not taking: Reported on 04/17/2024), Disp: , Rfl:    omeprazole (PRILOSEC) 20 MG capsule, SMARTSIG:1 Capsule(s) By Mouth Morning-Night (Patient not taking: Reported on 04/17/2024), Disp: , Rfl:  Medication Side Effects: none  Family Medical/ Social History: Changes?  none  MENTAL HEALTH EXAM:  There were no vitals taken for this visit.There is no height or weight on file to calculate BMI.  General Appearance: Casual and Well Groomed  Eye Contact:  Good  Speech:  Clear and Coherent  Volume:  Normal  Mood:  Euthymic  Affect:  Congruent  Thought Process:  Goal Directed and Descriptions of Associations: Circumstantial  Orientation:  Full (Time, Place, and Person)  Thought Content: Logical   Suicidal Thoughts:  No  Homicidal Thoughts:  No  Memory:  WNL  Judgement:  Good  Insight:  Good  Psychomotor Activity:  Normal  Concentration:  Concentration: Good and Attention Span: Good  Recall:  Good  Fund of Knowledge: Good  Language: Good  Assets:  Communication Skills Desire for Improvement Financial Resources/Insurance Housing Resilience Transportation Vocational/Educational  ADL's:  Intact  Cognition: WNL  Prognosis:  Good   DIAGNOSES:    ICD-10-CM   1. GAD (generalized anxiety disorder)  F41.1     2. Social anxiety disorder  F40.10  3. Recurrent major depressive disorder, in partial remission (HCC)  F33.41     4. Smoker  F17.200       Receiving Psychotherapy: No   RECOMMENDATIONS:  PDMP was reviewed.  Last Xanax  was filled 01/21/2024. I provided 20 minutes of face to face time during this encounter,  including time spent before and after the visit in records review, medical decision making, counseling pertinent to today's visit, and charting.   We discussed smoking cessation.  She is not ready to quit right now but I would like for her to decrease the amount she smokes, example if she is smoking 10 cigarettes a day right now, go down to 9 daily for a few weeks, then 8 daily for a few weeks, etc.  She understands.  I still recommend she work from home, indefinitely.  Working in the office would be detrimental to her mental health because of her social anxiety.  She will be due for a note for work in October 2025.  She will let us  know when that is needed and she can pick it up.  In the past I wrote it on an old prescription pad which was sufficient.  As far as her current medications are concerned she is doing well so no changes will be made.  Continue Xanax  0.5 mg twice daily as needed.  Continue Cymbalta  60 mg daily. Recommend multivitamin. Return in 6 months.  Marvia Slocumb, PA-C

## 2024-06-28 ENCOUNTER — Other Ambulatory Visit: Payer: Self-pay | Admitting: Physician Assistant

## 2024-07-22 ENCOUNTER — Telehealth: Payer: Self-pay | Admitting: Physician Assistant

## 2024-07-22 NOTE — Telephone Encounter (Signed)
 Pt is asking for an accommodation letter. You have previously written a note on an old prescription pad. She reports it has always been accepted. She is asking for another note dated 08/20/24 thru 08/20/25. She will pick up when ready.

## 2024-07-22 NOTE — Telephone Encounter (Signed)
 Pt lvm that she needs a Probation officer. Shawna Harper told her to call and she would write it. Please call her when ready to pick up at [phone]

## 2024-07-24 ENCOUNTER — Telehealth: Payer: Self-pay | Admitting: Physician Assistant

## 2024-07-24 NOTE — Telephone Encounter (Signed)
 I put the note in admin box to scan in chart, with note to have pt p/u.

## 2024-07-24 NOTE — Telephone Encounter (Signed)
 Advised Pt note on Rx pad to employer is ready to pick up. Pt to pick up 9/8.

## 2024-10-02 ENCOUNTER — Encounter (HOSPITAL_BASED_OUTPATIENT_CLINIC_OR_DEPARTMENT_OTHER): Payer: Self-pay

## 2024-10-02 ENCOUNTER — Emergency Department (HOSPITAL_BASED_OUTPATIENT_CLINIC_OR_DEPARTMENT_OTHER)

## 2024-10-02 ENCOUNTER — Emergency Department (HOSPITAL_BASED_OUTPATIENT_CLINIC_OR_DEPARTMENT_OTHER): Admission: EM | Admit: 2024-10-02 | Discharge: 2024-10-02 | Disposition: A | Discharge status: Home / Self Care

## 2024-10-02 ENCOUNTER — Other Ambulatory Visit: Payer: Self-pay

## 2024-10-02 ENCOUNTER — Telehealth (HOSPITAL_BASED_OUTPATIENT_CLINIC_OR_DEPARTMENT_OTHER): Payer: Self-pay

## 2024-10-02 DIAGNOSIS — K8020 Calculus of gallbladder without cholecystitis without obstruction: Secondary | ICD-10-CM | POA: Insufficient documentation

## 2024-10-02 DIAGNOSIS — R1011 Right upper quadrant pain: Secondary | ICD-10-CM | POA: Diagnosis present

## 2024-10-02 DIAGNOSIS — E876 Hypokalemia: Secondary | ICD-10-CM | POA: Diagnosis not present

## 2024-10-02 LAB — COMPREHENSIVE METABOLIC PANEL WITH GFR
ALT: 8 U/L (ref 0–44)
AST: 20 U/L (ref 15–41)
Albumin: 4.4 g/dL (ref 3.5–5.0)
Alkaline Phosphatase: 70 U/L (ref 38–126)
Anion gap: 14 (ref 5–15)
BUN: 11 mg/dL (ref 6–20)
CO2: 26 mmol/L (ref 22–32)
Calcium: 9 mg/dL (ref 8.9–10.3)
Chloride: 101 mmol/L (ref 98–111)
Creatinine, Ser: 0.82 mg/dL (ref 0.44–1.00)
GFR, Estimated: >60 mL/min (ref >60)
Glucose, Bld: 118 mg/dL — ABNORMAL HIGH (ref 70–99)
Potassium: 2.8 mmol/L — ABNORMAL LOW (ref 3.5–5.1)
Sodium: 142 mmol/L (ref 135–145)
Total Bilirubin: 0.9 mg/dL (ref 0.0–1.2)
Total Protein: 7.1 g/dL (ref 6.5–8.1)

## 2024-10-02 LAB — CBC
HCT: 46.2 % — ABNORMAL HIGH (ref 36.0–46.0)
Hemoglobin: 16 g/dL — ABNORMAL HIGH (ref 12.0–15.0)
MCH: 34.3 pg — ABNORMAL HIGH (ref 26.0–34.0)
MCHC: 34.6 g/dL (ref 30.0–36.0)
MCV: 98.9 fL (ref 80.0–100.0)
Platelets: 273 K/uL (ref 150–400)
RBC: 4.67 MIL/uL (ref 3.87–5.11)
RDW: 13.2 % (ref 11.5–15.5)
WBC: 6.8 K/uL (ref 4.0–10.5)
nRBC: 0 % (ref 0.0–0.2)

## 2024-10-02 LAB — LIPASE, BLOOD: Lipase: 26 U/L (ref 11–51)

## 2024-10-02 MED ORDER — POTASSIUM CHLORIDE CRYS ER 20 MEQ PO TBCR: 40.0000 meq | EXTENDED_RELEASE_TABLET | Freq: Every day | ORAL | 0 refills | Status: DC

## 2024-10-02 MED ORDER — POTASSIUM CHLORIDE CRYS ER 20 MEQ PO TBCR
40.0000 meq | EXTENDED_RELEASE_TABLET | Freq: Once | ORAL | Status: AC
  Administered 2024-10-02: 40 meq via ORAL
  Filled 2024-10-02: qty 2

## 2024-10-02 MED ORDER — PANTOPRAZOLE SODIUM 20 MG PO TBEC: 40.0000 mg | DELAYED_RELEASE_TABLET | Freq: Every day | ORAL | 0 refills | Status: AC

## 2024-10-02 MED ORDER — LISINOPRIL 10 MG PO TABS: 10.0000 mg | ORAL_TABLET | Freq: Every day | ORAL | 0 refills | Status: DC

## 2024-10-02 MED ORDER — KETOROLAC TROMETHAMINE 15 MG/ML IJ SOLN
15.0000 mg | Freq: Once | INTRAMUSCULAR | Status: AC
  Administered 2024-10-02: 15 mg via INTRAMUSCULAR
  Filled 2024-10-02: qty 1

## 2024-10-02 NOTE — ED Provider Notes (Signed)
 [facility] Provider Note   CSN: [id number] Arrival date & time: 10/02/24  1008     Patient presents with: Abdominal Pain   Shawna Harper is a 48 y.o. female.  {Add pertinent medical, surgical, social history, OB history to HPI:764} 48 year old female presents for evaluation of right upper quadrant abdominal pain.  States has been gone for a few weeks but getting worse.  States it is a constant nagging pain that radiates to her right back.  Admits to some nausea but no vomiting or diarrhea.  Denies any other symptoms or concerns   Abdominal Pain Associated symptoms: no chest pain, no chills, no cough, no dysuria, no fever, no hematuria, no shortness of breath, no sore throat and no vomiting        Prior to Admission medications   Medication Sig Start Date End Date Taking? Authorizing Provider  DULoxetine  (CYMBALTA ) 60 MG capsule TAKE 1 CAPSULE BY MOUTH EVERY DAY 06/30/24  Yes Hurst, Teresa T, PA-C  ALPRAZolam  (XANAX ) 0.5 MG tablet Take 1 tablet (0.5 mg total) by mouth 2 (two) times daily as needed. for anxiety 04/17/24   Rhys Boyer T, PA-C  lisinopril (PRINIVIL,ZESTRIL) 10 MG tablet Take 10 mg by mouth daily. Patient not taking: Reported on 02/01/2022    [provider]  omeprazole (PRILOSEC) 20 MG capsule SMARTSIG:1 Capsule(s) By Mouth Morning-Night Patient not taking: Reported on 02/01/2022 12/07/20   [provider]    Allergies: Vicodin [hydrocodone -acetaminophen ]    Review of Systems  Constitutional:  Negative for chills and fever.  HENT:  Negative for ear pain and sore throat.   Eyes:  Negative for pain and visual disturbance.  Respiratory:  Negative for cough and shortness of breath.   Cardiovascular:  Negative for chest pain and palpitations.  Gastrointestinal:  Positive for abdominal pain. Negative for vomiting.  Genitourinary:  Negative for dysuria and hematuria.  Musculoskeletal:  Negative for  arthralgias and back pain.  Skin:  Negative for color change and rash.  Neurological:  Negative for seizures and syncope.  All other systems reviewed and are negative.   Updated Vital Signs BP (!) 208/138   Pulse (!) 102   Temp 98 F (36.7 C) (Oral)   Resp 18   Wt 99.8 kg   SpO2 95%   BMI 35.51 kg/m   Physical Exam Vitals and nursing note reviewed.  Constitutional:      General: She is not in acute distress.    Appearance: She is well-developed. She is not ill-appearing.  HENT:     Head: Normocephalic and atraumatic.  Eyes:     Conjunctiva/sclera: Conjunctivae normal.  Cardiovascular:     Rate and Rhythm: Normal rate and regular rhythm.     Heart sounds: No murmur heard. Pulmonary:     Effort: Pulmonary effort is normal. No respiratory distress.     Breath sounds: Normal breath sounds.  Abdominal:     Palpations: Abdomen is soft.     Tenderness: There is abdominal tenderness in the right upper quadrant.  Musculoskeletal:        General: No swelling.     Cervical back: Neck supple.  Skin:    General: Skin is warm and dry.     Capillary Refill: Capillary refill takes less than 2 seconds.  Neurological:     Mental Status: She is alert.  Psychiatric:        Mood and Affect: Mood normal.     (all labs  ordered are listed, but only abnormal results are displayed) Labs Reviewed  LIPASE, BLOOD  COMPREHENSIVE METABOLIC PANEL WITH GFR  CBC  URINALYSIS, ROUTINE W REFLEX MICROSCOPIC  PREGNANCY, URINE    EKG: EKG Interpretation Date/Time:  Thursday October 02 2024 10:34:11 EST Ventricular Rate:  102 PR Interval:  164 QRS Duration:  91 QT Interval:  354 QTC Calculation: 462 R Axis:   60  Text Interpretation: Sinus tachycardia LAE, consider biatrial enlargement Probable anteroseptal infarct, old Compared with prior EKG from 12/18/2016 Confirmed by Gennaro Bouchard ([id number]) on 10/02/2024 10:37:31 AM  Radiology: No results found.  {Document cardiac monitor,  telemetry assessment procedure when appropriate:32947} Procedures   Medications Ordered in the ED - No data to display    {Click here for ABCD2, HEART and other calculators REFRESH Note before signing:1}                              Medical Decision Making Amount and/or Complexity of Data Reviewed Labs: ordered. Radiology: ordered.   ***  {Document critical care time when appropriate  Document review of labs and clinical decision tools ie CHADS2VASC2, etc  Document your independent review of radiology images and any outside records  Document your discussion with family members, caretakers and with consultants  Document social determinants of health affecting pt's care  Document your decision making why or why not admission, treatments were needed:32947:::1}   Final diagnoses:  None    ED Discharge Orders     None

## 2024-10-02 NOTE — Telephone Encounter (Signed)
 Pharmacy called to have patient's potassium prescription changed. Will update it and resent it thye could not do a verbal over the phone

## 2024-10-02 NOTE — Discharge Instructions (Addendum)
 Call and follow-up with general surgery.  Decrease your intake of fatty foods.  You can alternate Tylenol  and Motrin  as needed for pain.  Take your Protonix daily.  Return to the ER for new or worsening symptoms. Take your blood pressure medications as prescribed.

## 2024-10-02 NOTE — ED Triage Notes (Signed)
 Reports right upper abdominal pain x 2 weeks after lifting heavy bag of dog food. Pain increased and constant over the past 2 days.  Pt has been using ibuprofen  . Nauseated no vomiting  Not taking BP meds x 1 month

## 2024-10-13 ENCOUNTER — Encounter: Payer: Self-pay | Admitting: Physician Assistant

## 2024-10-13 ENCOUNTER — Ambulatory Visit (INDEPENDENT_AMBULATORY_CARE_PROVIDER_SITE_OTHER): Admitting: Physician Assistant

## 2024-10-13 DIAGNOSIS — F411 Generalized anxiety disorder: Secondary | ICD-10-CM | POA: Diagnosis not present

## 2024-10-13 DIAGNOSIS — F17200 Nicotine dependence, unspecified, uncomplicated: Secondary | ICD-10-CM

## 2024-10-13 DIAGNOSIS — F4010 Social phobia, unspecified: Secondary | ICD-10-CM

## 2024-10-13 DIAGNOSIS — F3342 Major depressive disorder, recurrent, in full remission: Secondary | ICD-10-CM | POA: Diagnosis not present

## 2024-10-13 MED ORDER — DULOXETINE HCL 60 MG PO CPEP: 60.0000 mg | ORAL_CAPSULE | Freq: Every day | ORAL | 1 refills | Status: DC

## 2024-10-13 MED ORDER — ALPRAZOLAM 0.5 MG PO TABS: 0.5000 mg | ORAL_TABLET | Freq: Two times a day (BID) | ORAL | 5 refills | Status: AC | PRN

## 2024-10-13 NOTE — Progress Notes (Signed)
 [facility]  Patient ID: Shawna Harper,  MRN: [id number]  PCP: Pcp, No  Date of Evaluation: 10/13/2024  Time spent:25 minutes  Chief Complaint:  Chief Complaint   Anxiety; Depression; Follow-up    HISTORY/CURRENT STATUS: HPI For routine med check.  She is doing well as far as her mental health goes.  Work is going fine.  She still works from home and that continues to work out great.  She is able to get more accomplished and does not have to deal with people which causes anxiety.  She is not having panic attacks.  She takes the Xanax  as needed and it is helpful.  She is able to enjoy things.  Energy and motivation are good. No extreme sadness, tearfulness, or feelings of hopelessness.  Sleeps well. ADLs and personal hygiene are normal.  No change in memory.  Appetite has not changed.  Weight is stable.  No mania, delirium, AH/VH.  No SI/HI.  Individual Medical History/ Review of Systems: Changes? :Yes     Has gallstones.  Had to go to the ER on 10/02/2024 because of the pain.  Surgery scheduled 12/15.  States she plans to quit smoking when she has surgery.  Past medications for mental health diagnoses include: Zoloft which caused increased anxiety, Celexa  quit working after many years, Wellbutrin  caused agitation and racing thoughts, Xanax , trazodone   Allergies: Vicodin [hydrocodone -acetaminophen ]  Current Medications:  Current Outpatient Medications:    lisinopril  (ZESTRIL ) 10 MG tablet, Take 10 mg by mouth., Disp: , Rfl:    pantoprazole  (PROTONIX ) 20 MG tablet, Take 2 tablets (40 mg total) by mouth daily., Disp: 60 tablet, Rfl: 0   ALPRAZolam  (XANAX ) 0.5 MG tablet, Take 1 tablet (0.5 mg total) by mouth 2 (two) times daily as needed. for anxiety, Disp: 30 tablet, Rfl: 5   DULoxetine  (CYMBALTA ) 60 MG capsule, Take 1 capsule (60 mg total) by mouth daily., Disp: 90 capsule, Rfl: 1   omeprazole (PRILOSEC) 20 MG capsule, SMARTSIG:1 Capsule(s) By Mouth Morning-Night  (Patient not taking: Reported on 02/01/2022), Disp: , Rfl:    potassium chloride  SA (KLOR-CON  M) 20 MEQ tablet, Take 2 tablets (40 mEq total) by mouth daily for 3 days. (Patient not taking: Reported on 10/13/2024), Disp: 6 tablet, Rfl: 0   potassium chloride  SA (KLOR-CON  M) 20 MEQ tablet, Take 2 tablets (40 mEq total) by mouth daily for 3 days. (Patient not taking: Reported on 10/13/2024), Disp: 6 tablet, Rfl: 0 Medication Side Effects: none  Family Medical/ Social History: Changes?  none  MENTAL HEALTH EXAM:  There were no vitals taken for this visit.There is no height or weight on file to calculate BMI.  General Appearance: Casual and Well Groomed  Eye Contact:  Good  Speech:  Clear and Coherent  Volume:  Normal  Mood:  Euthymic  Affect:  Congruent  Thought Process:  Goal Directed and Descriptions of Associations: Circumstantial  Orientation:  Full (Time, Place, and Person)  Thought Content: Logical   Suicidal Thoughts:  No  Homicidal Thoughts:  No  Memory:  WNL  Judgement:  Good  Insight:  Good  Psychomotor Activity:  Normal  Concentration:  Concentration: Good and Attention Span: Good  Recall:  Good  Fund of Knowledge: Good  Language: Good  Assets:  Communication Skills Desire for Improvement Financial Resources/Insurance Housing Resilience Social Support Transportation Vocational/Educational  ADL's:  Intact  Cognition: WNL  Prognosis:  Good   DIAGNOSES:    ICD-10-CM   1. Recurrent major depression in full  remission  F33.42     2. Social anxiety disorder  F40.10     3. GAD (generalized anxiety disorder)  F41.1     4. Smoker  F17.200       Receiving Psychotherapy: No   RECOMMENDATIONS:  PDMP was reviewed.  Last Xanax  was filled 09/18/2024. I provided approximately 25 minutes of face to face time during this encounter, including time spent before and after the visit in records review, medical decision making, counseling pertinent to today's visit, and  charting.   We discussed smoking cessation.  I provided approximately 17 minutes in counseling.  She is aware of options of Chantix, NicoDerm, Nicorette.  She has taken Wellbutrin  in the past and it caused more agitation so I would not recommend that.  She will let me know if she wants to start Chantix and the prescription can be sent in before the next visit.  She is doing well with the current medications so no changes will be made.  Continue Xanax  0.5 mg twice daily as needed.  Continue Cymbalta  60 mg  (she usually takes every other day.  Return in 6 months.  Verneita Cooks, PA-C

## 2024-10-25 ENCOUNTER — Other Ambulatory Visit: Payer: Self-pay | Admitting: Physician Assistant

## 2024-10-31 ENCOUNTER — Other Ambulatory Visit: Payer: Self-pay

## 2024-10-31 ENCOUNTER — Encounter (HOSPITAL_COMMUNITY): Payer: Self-pay | Admitting: Surgery

## 2024-10-31 NOTE — Progress Notes (Addendum)
 SDW CALL  Patient was given pre-op instructions over the phone. The opportunity was given for the patient to ask questions. No further questions asked. Patient verbalized understanding of instructions given.   PCP - does not have a PCP at this time Cardiologist - denies  PPM/ICD - denies   Chest x-ray - denies EKG - 10/02/24 Stress Test - denies ECHO - denies Cardiac Cath - denies  Sleep Study - denies  No DM  Last dose of GLP1 agonist-  n/a GLP1 instructions:  n/a  Blood Thinner Instructions: n/a Aspirin Instructions: n/a  ERAS Protcol - clears until 0615 PRE-SURGERY Ensure or G2- n/a  COVID TEST- n/a   Anesthesia review: no  Patient denies shortness of breath, fever, cough and chest pain over the phone call   All instructions explained to the patient, with a verbal understanding of the material. Patient agrees to go over the instructions while at home for a better understanding.    Patient states that after her potassium of 2.8 she was prescribed potassium to take at home for a few days and she completed that.

## 2024-11-03 ENCOUNTER — Other Ambulatory Visit (HOSPITAL_COMMUNITY): Payer: Self-pay

## 2024-11-03 ENCOUNTER — Ambulatory Visit (HOSPITAL_COMMUNITY): Admitting: Registered Nurse

## 2024-11-03 ENCOUNTER — Encounter (HOSPITAL_COMMUNITY): Admission: RE | Disposition: A | Payer: Self-pay | Source: Home / Self Care | Attending: Surgery

## 2024-11-03 ENCOUNTER — Other Ambulatory Visit: Payer: Self-pay

## 2024-11-03 ENCOUNTER — Ambulatory Visit (HOSPITAL_COMMUNITY): Admission: RE | Admit: 2024-11-03 | Discharge: 2024-11-03 | Disposition: A | Attending: Surgery | Admitting: Surgery

## 2024-11-03 ENCOUNTER — Encounter (HOSPITAL_COMMUNITY): Payer: Self-pay | Admitting: Surgery

## 2024-11-03 DIAGNOSIS — K219 Gastro-esophageal reflux disease without esophagitis: Secondary | ICD-10-CM | POA: Insufficient documentation

## 2024-11-03 DIAGNOSIS — I10 Essential (primary) hypertension: Secondary | ICD-10-CM | POA: Diagnosis not present

## 2024-11-03 DIAGNOSIS — K8020 Calculus of gallbladder without cholecystitis without obstruction: Secondary | ICD-10-CM | POA: Diagnosis present

## 2024-11-03 DIAGNOSIS — F419 Anxiety disorder, unspecified: Secondary | ICD-10-CM | POA: Diagnosis not present

## 2024-11-03 DIAGNOSIS — F32A Depression, unspecified: Secondary | ICD-10-CM | POA: Insufficient documentation

## 2024-11-03 DIAGNOSIS — K8010 Calculus of gallbladder with chronic cholecystitis without obstruction: Secondary | ICD-10-CM | POA: Diagnosis not present

## 2024-11-03 DIAGNOSIS — F17210 Nicotine dependence, cigarettes, uncomplicated: Secondary | ICD-10-CM | POA: Diagnosis not present

## 2024-11-03 HISTORY — PX: CHOLECYSTECTOMY: SHX55

## 2024-11-03 LAB — BASIC METABOLIC PANEL WITH GFR
Anion gap: 12 (ref 5–15)
BUN: 10 mg/dL (ref 6–20)
CO2: 25 mmol/L (ref 22–32)
Calcium: 9 mg/dL (ref 8.9–10.3)
Chloride: 101 mmol/L (ref 98–111)
Creatinine, Ser: 0.85 mg/dL (ref 0.44–1.00)
GFR, Estimated: >60 mL/min (ref >60)
Glucose, Bld: 116 mg/dL — ABNORMAL HIGH (ref 70–99)
Potassium: 3.1 mmol/L — ABNORMAL LOW (ref 3.5–5.1)
Sodium: 138 mmol/L (ref 135–145)

## 2024-11-03 LAB — CBC
HCT: 46.2 % — ABNORMAL HIGH (ref 36.0–46.0)
Hemoglobin: 16.2 g/dL — ABNORMAL HIGH (ref 12.0–15.0)
MCH: 35.5 pg — ABNORMAL HIGH (ref 26.0–34.0)
MCHC: 35.1 g/dL (ref 30.0–36.0)
MCV: 101.3 fL — ABNORMAL HIGH (ref 80.0–100.0)
Platelets: 343 K/uL (ref 150–400)
RBC: 4.56 MIL/uL (ref 3.87–5.11)
RDW: 13.3 % (ref 11.5–15.5)
WBC: 8.5 K/uL (ref 4.0–10.5)
nRBC: 0 % (ref 0.0–0.2)

## 2024-11-03 LAB — POCT PREGNANCY, URINE: Preg Test, Ur: NEGATIVE

## 2024-11-03 SURGERY — LAPAROSCOPIC CHOLECYSTECTOMY
Anesthesia: General | Site: Abdomen

## 2024-11-03 MED ORDER — IBUPROFEN 600 MG PO TABS
600.0000 mg | ORAL_TABLET | Freq: Four times a day (QID) | ORAL | 1 refills | Status: AC | PRN
  Filled 2024-11-03: qty 120, 30d supply, fill #0

## 2024-11-03 MED ORDER — ROCURONIUM BROMIDE 10 MG/ML (PF) SYRINGE
PREFILLED_SYRINGE | INTRAVENOUS | Status: AC
  Filled 2024-11-03: qty 10

## 2024-11-03 MED ORDER — ACETAMINOPHEN 10 MG/ML IV SOLN: 1000.0000 mg | Freq: Once | INTRAVENOUS | Status: DC | PRN

## 2024-11-03 MED ORDER — FENTANYL CITRATE (PF) 100 MCG/2ML IJ SOLN
INTRAMUSCULAR | Status: AC
  Filled 2024-11-03: qty 2

## 2024-11-03 MED ORDER — BUPIVACAINE-EPINEPHRINE (PF) 0.25% -1:200000 IJ SOLN
INTRAMUSCULAR | Status: AC
  Filled 2024-11-03: qty 30

## 2024-11-03 MED ORDER — POLYETHYLENE GLYCOL 3350 17 GM/SCOOP PO POWD
17.0000 g | Freq: Every day | ORAL | 1 refills | Status: AC
  Filled 2024-11-03: qty 238, 14d supply, fill #0

## 2024-11-03 MED ORDER — CEFAZOLIN SODIUM-DEXTROSE 2-4 GM/100ML-% IV SOLN
INTRAVENOUS | Status: AC
  Filled 2024-11-03: qty 100

## 2024-11-03 MED ORDER — SUGAMMADEX SODIUM 200 MG/2ML IV SOLN
INTRAVENOUS | Status: DC | PRN
  Administered 2024-11-03: 11:00:00 500 mg via INTRAVENOUS

## 2024-11-03 MED ORDER — ONDANSETRON HCL 4 MG/2ML IJ SOLN
INTRAMUSCULAR | Status: DC | PRN
  Administered 2024-11-03: 11:00:00 4 mg via INTRAVENOUS

## 2024-11-03 MED ORDER — OXYCODONE HCL 5 MG PO TABS
5.0000 mg | ORAL_TABLET | ORAL | 0 refills | Status: AC | PRN
  Filled 2024-11-03: qty 15, 3d supply, fill #0

## 2024-11-03 MED ORDER — 0.9 % SODIUM CHLORIDE (POUR BTL) OPTIME
TOPICAL | Status: DC | PRN
  Administered 2024-11-03: 11:00:00 1000 mL

## 2024-11-03 MED ORDER — PROPOFOL 10 MG/ML IV BOLUS
INTRAVENOUS | Status: AC
  Filled 2024-11-03: qty 20

## 2024-11-03 MED ORDER — ACETAMINOPHEN 500 MG PO TABS
1000.0000 mg | ORAL_TABLET | Freq: Four times a day (QID) | ORAL | 3 refills | Status: AC
  Filled 2024-11-03: qty 120, 15d supply, fill #0

## 2024-11-03 MED ORDER — PROPOFOL 10 MG/ML IV BOLUS
INTRAVENOUS | Status: DC | PRN
  Administered 2024-11-03: 11:00:00 200 mg via INTRAVENOUS

## 2024-11-03 MED ORDER — CHLORHEXIDINE GLUCONATE 0.12 % MT SOLN
15.0000 mL | Freq: Once | OROMUCOSAL | Status: AC
  Administered 2024-11-03: 07:00:00 15 mL via OROMUCOSAL

## 2024-11-03 MED ORDER — ROCURONIUM BROMIDE 10 MG/ML (PF) SYRINGE
PREFILLED_SYRINGE | INTRAVENOUS | Status: DC | PRN
  Administered 2024-11-03: 11:00:00 90 mg via INTRAVENOUS

## 2024-11-03 MED ORDER — OXYCODONE HCL 5 MG PO TABS: 5.0000 mg | ORAL_TABLET | Freq: Once | ORAL | Status: DC | PRN

## 2024-11-03 MED ORDER — ORAL CARE MOUTH RINSE: 15.0000 mL | Freq: Once | OROMUCOSAL | Status: AC

## 2024-11-03 MED ORDER — DEXMEDETOMIDINE HCL IN NACL 80 MCG/20ML IV SOLN
INTRAVENOUS | Status: DC | PRN
  Administered 2024-11-03: 11:00:00 12 ug via INTRAVENOUS
  Administered 2024-11-03: 11:00:00 8 ug via INTRAVENOUS

## 2024-11-03 MED ORDER — LIDOCAINE 2% (20 MG/ML) 5 ML SYRINGE
INTRAMUSCULAR | Status: DC | PRN
  Administered 2024-11-03: 11:00:00 80 mg via INTRAVENOUS

## 2024-11-03 MED ORDER — SODIUM CHLORIDE 0.9 % IV SOLN: INTRAVENOUS | Status: DC | PRN

## 2024-11-03 MED ORDER — LIDOCAINE 2% (20 MG/ML) 5 ML SYRINGE
INTRAMUSCULAR | Status: AC
  Filled 2024-11-03: qty 5

## 2024-11-03 MED ORDER — ACETAMINOPHEN 10 MG/ML IV SOLN
INTRAVENOUS | Status: DC | PRN
  Administered 2024-11-03: 11:00:00 1000 mg via INTRAVENOUS

## 2024-11-03 MED ORDER — SODIUM CHLORIDE 0.9 % IR SOLN
Status: DC | PRN
  Administered 2024-11-03: 11:00:00 1

## 2024-11-03 MED ORDER — FENTANYL CITRATE (PF) 100 MCG/2ML IJ SOLN
25.0000 ug | INTRAMUSCULAR | Status: DC | PRN
  Administered 2024-11-03 (×2): 50 ug via INTRAVENOUS

## 2024-11-03 MED ORDER — LACTATED RINGERS IV SOLN: INTRAVENOUS | Status: DC

## 2024-11-03 MED ORDER — HYDRALAZINE HCL 20 MG/ML IJ SOLN
INTRAMUSCULAR | Status: AC
  Administered 2024-11-03: 09:00:00 10 mg via INTRAVENOUS
  Filled 2024-11-03: qty 1

## 2024-11-03 MED ORDER — ACETAMINOPHEN 10 MG/ML IV SOLN
INTRAVENOUS | Status: AC
  Filled 2024-11-03: qty 100

## 2024-11-03 MED ORDER — FENTANYL CITRATE (PF) 250 MCG/5ML IJ SOLN
INTRAMUSCULAR | Status: DC | PRN
  Administered 2024-11-03: 11:00:00 100 ug via INTRAVENOUS
  Administered 2024-11-03 (×3): 50 ug via INTRAVENOUS

## 2024-11-03 MED ORDER — CEFAZOLIN SODIUM-DEXTROSE 2-4 GM/100ML-% IV SOLN
2.0000 g | Freq: Once | INTRAVENOUS | Status: DC
  Filled 2024-11-03: qty 100

## 2024-11-03 MED ORDER — HYDROMORPHONE HCL 1 MG/ML IJ SOLN
INTRAMUSCULAR | Status: AC
  Filled 2024-11-03: qty 0.5

## 2024-11-03 MED ORDER — ONDANSETRON HCL 4 MG/2ML IJ SOLN
INTRAMUSCULAR | Status: AC
  Filled 2024-11-03: qty 2

## 2024-11-03 MED ORDER — MIDAZOLAM HCL 2 MG/2ML IJ SOLN
INTRAMUSCULAR | Status: AC
  Filled 2024-11-03: qty 2

## 2024-11-03 MED ORDER — HYDROMORPHONE HCL 1 MG/ML IJ SOLN
INTRAMUSCULAR | Status: DC | PRN
  Administered 2024-11-03: 12:00:00 .5 mg via INTRAVENOUS

## 2024-11-03 MED ORDER — HYDRALAZINE HCL 20 MG/ML IJ SOLN
INTRAMUSCULAR | Status: AC
  Filled 2024-11-03: qty 1

## 2024-11-03 MED ORDER — PHENYLEPHRINE 80 MCG/ML (10ML) SYRINGE FOR IV PUSH (FOR BLOOD PRESSURE SUPPORT)
PREFILLED_SYRINGE | INTRAVENOUS | Status: DC | PRN
  Administered 2024-11-03: 11:00:00 80 ug via INTRAVENOUS

## 2024-11-03 MED ORDER — METHOCARBAMOL 750 MG PO TABS
750.0000 mg | ORAL_TABLET | Freq: Four times a day (QID) | ORAL | 1 refills | Status: AC
  Filled 2024-11-03: qty 120, 30d supply, fill #0

## 2024-11-03 MED ORDER — HYDRALAZINE HCL 20 MG/ML IJ SOLN: 10.0000 mg | Freq: Once | INTRAMUSCULAR | Status: AC

## 2024-11-03 MED ORDER — HYDRALAZINE HCL 20 MG/ML IJ SOLN
INTRAMUSCULAR | Status: DC | PRN
  Administered 2024-11-03: 11:00:00 10 mg via INTRAVENOUS

## 2024-11-03 MED ORDER — MIDAZOLAM HCL (PF) 2 MG/2ML IJ SOLN
INTRAMUSCULAR | Status: DC | PRN
  Administered 2024-11-03: 10:00:00 2 mg via INTRAVENOUS

## 2024-11-03 MED ORDER — BUPIVACAINE-EPINEPHRINE (PF) 0.25% -1:200000 IJ SOLN
INTRAMUSCULAR | Status: DC | PRN
  Administered 2024-11-03: 11:00:00 30 mL

## 2024-11-03 MED ORDER — OXYCODONE HCL 5 MG/5ML PO SOLN: 5.0000 mg | Freq: Once | ORAL | Status: DC | PRN

## 2024-11-03 MED ORDER — DEXAMETHASONE SOD PHOSPHATE PF 10 MG/ML IJ SOLN
INTRAMUSCULAR | Status: DC | PRN
  Administered 2024-11-03: 11:00:00 10 mg via INTRAVENOUS

## 2024-11-03 MED ORDER — HEMOSTATIC AGENTS (NO CHARGE) OPTIME
TOPICAL | Status: DC | PRN
  Administered 2024-11-03: 11:00:00 1 via TOPICAL

## 2024-11-03 MED ORDER — LABETALOL HCL 5 MG/ML IV SOLN
INTRAVENOUS | Status: DC | PRN
  Administered 2024-11-03: 11:00:00 5 mg via INTRAVENOUS

## 2024-11-03 SURGICAL SUPPLY — 30 items
CANISTER SUCTION 3000ML PPV (SUCTIONS) ×1 IMPLANT
CHLORAPREP W/TINT 26 (MISCELLANEOUS) ×1 IMPLANT
CLIP APPLIE 5 13 M/L LIGAMAX5 (MISCELLANEOUS) ×1 IMPLANT
COVER SURGICAL LIGHT HANDLE (MISCELLANEOUS) ×1 IMPLANT
DERMABOND ADVANCED .7 DNX12 (GAUZE/BANDAGES/DRESSINGS) ×1 IMPLANT
ELECT CAUTERY BLADE 6.4 (BLADE) ×1 IMPLANT
ELECTRODE REM PT RTRN 9FT ADLT (ELECTROSURGICAL) ×1 IMPLANT
GLOVE BIO SURGEON STRL SZ 6.5 (GLOVE) ×1 IMPLANT
GLOVE BIOGEL PI IND STRL 6 (GLOVE) ×1 IMPLANT
GOWN STRL REUS W/ TWL LRG LVL3 (GOWN DISPOSABLE) ×3 IMPLANT
HEMOSTAT SNOW SURGICEL 2X4 (HEMOSTASIS) IMPLANT
IRRIGATION SUCT STRKRFLW 2 WTP (MISCELLANEOUS) IMPLANT
KIT BASIN OR (CUSTOM PROCEDURE TRAY) ×1 IMPLANT
KIT TURNOVER KIT B (KITS) ×1 IMPLANT
PAD ARMBOARD POSITIONER FOAM (MISCELLANEOUS) ×1 IMPLANT
PENCIL BUTTON HOLSTER BLD 10FT (ELECTRODE) ×1 IMPLANT
POUCH RETRIEVAL ECOSAC 10 (ENDOMECHANICALS) ×1 IMPLANT
SCISSORS LAP 5X35 DISP (ENDOMECHANICALS) ×1 IMPLANT
SET TUBE SMOKE EVAC HIGH FLOW (TUBING) ×1 IMPLANT
SLEEVE Z-THREAD 5X100MM (TROCAR) ×2 IMPLANT
SOLN 0.9% NACL POUR BTL 1000ML (IV SOLUTION) ×1 IMPLANT
SOLN STERILE WATER BTL 1000 ML (IV SOLUTION) ×1 IMPLANT
SUT MNCRL AB 4-0 PS2 18 (SUTURE) ×1 IMPLANT
SUT VIC AB 0 UR5 27 (SUTURE) IMPLANT
SUT VICRYL 0 UR6 27IN ABS (SUTURE) IMPLANT
TOWEL GREEN STERILE FF (TOWEL DISPOSABLE) ×1 IMPLANT
TRAY LAPAROSCOPIC MC (CUSTOM PROCEDURE TRAY) ×1 IMPLANT
TROCAR BALLN 12MMX100 BLUNT (TROCAR) ×1 IMPLANT
TROCAR Z-THREAD OPTICAL 5X100M (TROCAR) ×1 IMPLANT
WARMER LAPAROSCOPE (MISCELLANEOUS) ×1 IMPLANT

## 2024-11-03 NOTE — Anesthesia Preprocedure Evaluation (Signed)
 Anesthesia Evaluation  Patient identified by MRN, date of birth, ID band Patient awake    Reviewed: Allergy & Precautions, NPO status , Patient's Chart, lab work & pertinent test results  History of Anesthesia Complications Negative for: history of anesthetic complications  Airway Mallampati: III  TM Distance: >3 FB Neck ROM: Full    Dental  (+) Teeth Intact, Dental Advisory Given   Pulmonary Current SmokerPatient did not abstain from smoking.   breath sounds clear to auscultation       Cardiovascular hypertension, Pt. on medications (-) angina (-) Past MI and (-) CHF  Rhythm:Regular     Neuro/Psych neg Seizures PSYCHIATRIC DISORDERS Anxiety Depression       GI/Hepatic Neg liver ROS,GERD  Medicated and Controlled,,  Endo/Other  negative endocrine ROS    Renal/GU negative Renal ROSLab Results      Component                Value               Date                      NA                       138                 11/03/2024                K                        3.1 (L)             11/03/2024                CO2                      25                  11/03/2024                GLUCOSE                  116 (H)             11/03/2024                BUN                      10                  11/03/2024                CREATININE               0.85                11/03/2024                CALCIUM                  9.0                 11/03/2024                GFRNONAA                 >60  11/03/2024                Musculoskeletal negative musculoskeletal ROS (+)    Abdominal   Peds  Hematology negative hematology ROS (+) Lab Results      Component                Value               Date                      WBC                      8.5                 11/03/2024                HGB                      16.2 (H)            11/03/2024                HCT                      46.2 (H)            11/03/2024                 MCV                      101.3 (H)           11/03/2024                PLT                      343                 11/03/2024              Anesthesia Other Findings   Reproductive/Obstetrics                              Anesthesia Physical Anesthesia Plan  ASA: 2  Anesthesia Plan: General   Post-op Pain Management: Ofirmev  IV (intra-op)* and Toradol  IV (intra-op)*   Induction: Intravenous  PONV Risk Score and Plan: 3 and Ondansetron , Dexamethasone  and Midazolam   Airway Management Planned: Oral ETT  Additional Equipment: None  Intra-op Plan:   Post-operative Plan: Extubation in OR  Informed Consent: I have reviewed the patients History and Physical, chart, labs and discussed the procedure including the risks, benefits and alternatives for the proposed anesthesia with the patient or authorized representative who has indicated his/her understanding and acceptance.     Dental advisory given  Plan Discussed with: CRNA  Anesthesia Plan Comments:          Anesthesia Quick Evaluation

## 2024-11-03 NOTE — Transfer of Care (Signed)
 Immediate Anesthesia Transfer of Care Note  Patient: Shawna Harper  Procedure(s) Performed: LAPAROSCOPIC CHOLECYSTECTOMY (Abdomen)  Patient Location: PACU  Anesthesia Type:General  Level of Consciousness: awake  Airway & Oxygen Therapy: Patient Spontanous Breathing  Post-op Assessment: Report given to RN and Post -op Vital signs reviewed and stable  Post vital signs: Reviewed and stable  Last Vitals:  Vitals Value Taken Time  BP 159/90 11/03/24 11:40  Temp 36.5 C 11/03/24 11:40  Pulse 87 11/03/24 11:45  Resp 13 11/03/24 11:45  SpO2 95 % 11/03/24 11:45  Vitals shown include unfiled device data.  Last Pain:  Vitals:   11/03/24 0710  TempSrc:   PainSc: 0-No pain      Patients Stated Pain Goal: 0 (11/03/24 0710)  Complications: No notable events documented.

## 2024-11-03 NOTE — Op Note (Signed)
° °  Operative Note  Date: 11/03/2024  Procedure: laparoscopic cholecystectomy  Pre-op diagnosis: symptomatic cholelithiasis Post-op diagnosis: same  Indication and clinical history: The patient is a 48 y.o. year old female with symptomatic cholelithiasis  Surgeon: Dreama GEANNIE Hanger, MD  Anesthesiologist: Leopoldo, MD Anesthesia: General  Findings:  Specimen: gallbladder EBL: 10cc Drains/Implants: surgicel snow  Disposition: PACU - hemodynamically stable.  Description of procedure: The patient was positioned supine on the operating room table. Time-out was performed verifying correct patient, procedure, signature of informed consent, and administration of pre-operative antibiotics. General anesthetic induction and intubation were uneventful. The abdomen was prepped and draped in the usual sterile fashion. An infra-umbilical incision was made using an open technique using zero vicryl stay sutures on either side of the fascia and a 10mm Hassan port inserted. After establishing pneumoperitoneum, which the patient tolerated well, the abdominal cavity was inspected and no injury of any intra-abdominal structures was identified. Additional ports were placed under direct visualization and using local anesthetic: two 5mm ports in the right subcostal region and a 5mm port in the epigastric region. The patient was re-positioned to reverse Trendelenburg and right side up. Adhesiolysis was performed to expose the gallbladder, which was then retracted cephalad. The infundibulum was identified and retracted toward the right lower quadrant. The peritoneum was incised over the infundibulum and the triangle of Calot dissected to expose the critical view of safety. The cystic duct was clearly identified and doubly clipped and divided. The cystic artery was then identified and clipped and divided. The gallbladder was dissected off the liver bed using electrocautery and hemostasis of the liver bed was confirmed prior  to separation of the final peritoneal attachments of the gallbladder to the liver bed. The gallbladder fossa was irrigated and fluid returned clear. After transection of the final peritoneal attachments, the gallbladder was placed in an endoscopic specimen retrieval bag, removed via the umbilical port site, and sent to pathology as a permanent specimen. The gallbladder fossa was inspected confirming hemostasis, the absence of bile leakage from the cystic duct stump, and correct placement of clips on the cystic artery and cystic duct stumps. There was a minimal amount of ooze, so a piece of surgicel was placed in the gallbladder fossa. The abdomen was desufflated and the fascia of the umbilical port site was closed using the previously placed stay sutures. Additional local anesthetic was administered at the umbilical port site.  The skin of all incisions was closed with 4-0 monocryl. Sterile dressings were applied. All sponge and instrument counts were correct at the conclusion of the procedure. The patient was awakened from anesthesia, extubated uneventfully, and transported to the PACU - hemodynamically stable.. There were no complications.    Dreama GEANNIE Hanger, MD General and Trauma Surgery [facility]

## 2024-11-03 NOTE — Anesthesia Procedure Notes (Signed)
 Procedure Name: Intubation Date/Time: 11/03/2024 10:37 AM  Performed by: Virgil Ee, CRNAPre-anesthesia Checklist: Patient identified, Patient being monitored, Timeout performed, Emergency Drugs available and Suction available Patient Re-evaluated:Patient Re-evaluated prior to induction Oxygen Delivery Method: Circle system utilized Preoxygenation: Pre-oxygenation with 100% oxygen Induction Type: IV induction Ventilation: Mask ventilation without difficulty Laryngoscope Size: Mac and 4 Grade View: Grade I Tube type: Oral Tube size: 7.0 mm Number of attempts: 1 Airway Equipment and Method: Stylet Placement Confirmation: ETT inserted through vocal cords under direct vision, positive ETCO2 and breath sounds checked- equal and bilateral Secured at: 22 cm Tube secured with: Tape Dental Injury: Teeth and Oropharynx as per pre-operative assessment

## 2024-11-03 NOTE — H&P (Signed)
 Shawna Harper is an 48 y.o. female.   HPI: 78F with symptomatic cholelithiasis. Plan lap chole. The patient has had no hospitalizations, doctors visits, ER visits, surgeries, or newly diagnosed allergies since being seen in the office.    Past Medical History:  Diagnosis Date   Anxiety    COPD (chronic obstructive pulmonary disease) (HCC)    Depression    GERD (gastroesophageal reflux disease)    Hypertension     Past Surgical History:  Procedure Laterality Date   COLONOSCOPY     TONSILLECTOMY     UPPER GASTROINTESTINAL ENDOSCOPY  2005    Family History  Problem Relation Age of Onset   Cancer Mother 59       Non Hodgkins Lymph   Diabetes Father    Diabetes Paternal Grandmother    Colon cancer Neg Hx    Stomach cancer Neg Hx    Stroke Neg Hx     Social History:  reports that she has been smoking cigarettes. She started smoking about 33 years ago. She has a 17 pack-year smoking history. She has never used smokeless tobacco. She reports current alcohol use of about 1.0 standard drink of alcohol per week. She reports current drug use. Frequency: 1.00 time per week. Drug: Marijuana.  Allergies: Allergies[1]  Medications: I have reviewed the patient's current medications.  Results for orders placed or performed during the hospital encounter of 11/03/24 (from the past 48 hours)  Basic metabolic panel per protocol     Status: Abnormal   Collection Time: 11/03/24  6:59 AM  Result Value Ref Range   Sodium 138 135 - 145 mmol/L   Potassium 3.1 (L) 3.5 - 5.1 mmol/L   Chloride 101 98 - 111 mmol/L   CO2 25 22 - 32 mmol/L   Glucose, Bld 116 (H) 70 - 99 mg/dL    Comment: Glucose reference range applies only to samples taken after fasting for at least 8 hours.   BUN 10 6 - 20 mg/dL   Creatinine, Ser 9.14 0.44 - 1.00 mg/dL   Calcium 9.0 8.9 - 89.6 mg/dL   GFR, Estimated >39 >39 mL/min    Comment: (NOTE) Calculated using the CKD-EPI Creatinine Equation (2021)    Anion gap 12 5  - 15    Comment: Performed at [facility], [address] [place], [place] [address]  CBC per protocol     Status: Abnormal   Collection Time: 11/03/24  6:59 AM  Result Value Ref Range   WBC 8.5 4.0 - 10.5 K/uL   RBC 4.56 3.87 - 5.11 MIL/uL   Hemoglobin 16.2 (H) 12.0 - 15.0 g/dL   HCT 53.7 (H) 63.9 - 53.9 %   MCV 101.3 (H) 80.0 - 100.0 fL   MCH 35.5 (H) 26.0 - 34.0 pg   MCHC 35.1 30.0 - 36.0 g/dL   RDW 86.6 88.4 - 84.4 %   Platelets 343 150 - 400 K/uL   nRBC 0.0 0.0 - 0.2 %    Comment: Performed at [facility], [address] [place], [place] [address]  Pregnancy, urine POC     Status: None   Collection Time: 11/03/24  7:04 AM  Result Value Ref Range   Preg Test, Ur NEGATIVE NEGATIVE    Comment:        THE SENSITIVITY OF THIS METHODOLOGY IS >20 mIU/mL.     No results found.  ROS 10 point review of systems is negative except as listed above  in HPI.   Physical Exam Blood pressure (!) 191/109, pulse 80, temperature 98.5 F (36.9 C), temperature source Oral, resp. rate 20, height 5' 6 (1.676 m), weight 99.8 kg, SpO2 97%. Constitutional: well-developed, well-nourished HEENT: pupils equal, round, reactive to light, 2mm b/l, moist conjunctiva, external inspection of ears and nose normal, hearing intact Oropharynx: normal oropharyngeal mucosa, normal dentition Neck: no thyromegaly, trachea midline, no midline cervical tenderness to palpation Chest: breath sounds equal bilaterally, normal respiratory effort, no midline or lateral chest wall tenderness to palpation/deformity Abdomen: soft, NT, no bruising, no hepatosplenomegaly GU: normal female genitalia  Skin: warm, dry, no rashes Psych: normal memory, normal mood/affect     Assessment/Plan: Symptomatic cholelithiasis - plan lap chole. Informed consent was obtained after detailed explanation of risks, including bleeding, infection, biloma, hematoma, injury to common bile duct, need for IOC to delineate anatomy,  and need for conversion to open procedure. All questions answered to the patient's satisfaction. FEN - NPO except sips/chips DVT - SCDs, LMWH Dispo - plan home post-op    Shawna GEANNIE Hanger, MD General and Trauma Surgery [facility]     [1]  Allergies Allergen Reactions   Vicodin [Hydrocodone -Acetaminophen ] Itching

## 2024-11-03 NOTE — Discharge Instructions (Addendum)
 [facility]  LAPAROSCOPIC SURGERY: POST OP INSTRUCTIONS Always review your discharge instruction sheet given to you by the facility where your surgery was performed. IF YOU HAVE DISABILITY OR FAMILY LEAVE FORMS, YOU MUST BRING THEM TO THE OFFICE FOR PROCESSING.   DO NOT GIVE THEM TO YOUR DOCTOR.  PAIN CONTROL  Pain regimen: take over-the-counter tylenol  (acetaminophen ) 1000mg  every six hours, the prescription ibuprofen  (600mg ) every six hours and the robaxin  (methocarbamol ) 750mg  every six hours. With all three of these, you should be taking something every two hours. Example: tylenol  (acetaminophen ) at 8am, ibuprofen  at 10am, robaxin  (methocarbamol ) at 12pm, tylenol  (acetaminophen ) again at 2pm, ibuprofen  again at 4pm, robaxin  (methocarbamol ) at 6pm. You also have a prescription for oxycodone , which should be taken if the tylenol  (acetaminophen ), ibuprofen , and robaxin  (methocarbamol ) are not enough to control your pain. You may take the oxycodone  as frequently as every four hours as needed, but if you are taking the other medications as above, you should not need the oxycodone  this frequently. You have also been given a prescription for Miralax  which is a stool softener. Please take this as prescribed because the oxycodone  can cause constipation and the Miralax  will minimize or prevent constipation. Do not drive while taking or under the influence of the oxycodone  as it is a narcotic medication. Use ice packs to help control pain.  If you need a refill on your pain medication, please contact your pharmacy.  They will contact our office to request authorization. Prescriptions will not be filled after 5pm or on week-ends.  HOME MEDICATIONS Take your usually prescribed medications unless otherwise directed.  DIET You should follow a light diet the first few days after arrival home.  Be sure to include lots of fluids daily.  Do not consume alcohol while taking oxycodone  or  ibuprofen .   CONSTIPATION It is common to experience some constipation after surgery and if you are taking pain medication. Constipation will make your abdominal pain worse, so it is best to try to prevent it by increasing fluid intake and taking a stool softener. You have already been given a prescription for a mild laxative, Miralax , which you should be taking once daily. You can increase the Miralax  to twice daily or even three times daily until you have a bowel movement. If still no bowel movement 24 hours after taking Miralax  three times in one day, you may try magnesium citrate, available over the counter at a local pharmacy.   WOUND/INCISION CARE Most patients will experience some swelling and bruising in the area of the incisions.  Ice packs will help.  Swelling and bruising can take several days to resolve.  May shower beginning 11/04/2024.  Do not peel off or scrub skin glue. May allow warm soapy water to run over incision, then rinse and pat dry.  Do not soak in any water (tubs, hot tubs, pools, lakes, oceans) for one week.   ACTIVITIES You may resume regular (light) daily activities beginning the next day--such as daily self-care, walking, climbing stairs--gradually increasing activities as tolerated.  You may have sexual intercourse when it is comfortable.   No lifting greater than 5 pounds for six weeks.  You may drive when you are no longer taking narcotic pain medication, you can comfortably wear a seatbelt, and you can safely maneuver your car and apply brakes.  FOLLOW-UP You should see your doctor in the office for a follow-up appointment approximately 2-3 weeks after your surgery.  You should have  been given your post-op/follow-up appointment when your surgery was scheduled.  If you did not receive a post-op/follow-up appointment, make sure that you call for this appointment within a day or two after you arrive home to ensure a convenient appointment time.  WHEN TO CALL YOUR  DOCTOR: Fever over 101.5 Inability to urinate Continued bleeding from incision. Increased pain, redness, or drainage from the incision. Increasing abdominal pain  The clinic staff is available to answer your questions during regular business hours.  Please dont hesitate to call and ask to speak to one of the nurses for clinical concerns.  If you have a medical emergency, go to the nearest emergency room or call 911.  A surgeon from [facility] is always on call at the hospital. [address], [address], [place], [place]  [address] ? [address] [address], [place], [place]   [address] [phone] ? [phone] ? FAX [phone] Web site: [email or web address]

## 2024-11-04 ENCOUNTER — Encounter (HOSPITAL_COMMUNITY): Payer: Self-pay | Admitting: Surgery

## 2024-11-04 LAB — SURGICAL PATHOLOGY

## 2024-11-04 NOTE — Anesthesia Postprocedure Evaluation (Signed)
 Anesthesia Post Note  Patient: Shawna Harper  Procedure(s) Performed: LAPAROSCOPIC CHOLECYSTECTOMY (Abdomen)     Patient location during evaluation: PACU Anesthesia Type: General Level of consciousness: awake and alert Pain management: pain level controlled Vital Signs Assessment: post-procedure vital signs reviewed and stable Respiratory status: spontaneous breathing, nonlabored ventilation and respiratory function stable Cardiovascular status: blood pressure returned to baseline and stable Postop Assessment: no apparent nausea or vomiting Anesthetic complications: no   No notable events documented.                Isak Sotomayor

## 2024-11-05 NOTE — Addendum Note (Signed)
 Addendum  created 11/05/24 1301 by Virgil Ee, CRNA   Intraprocedure Meds edited

## 2024-11-12 ENCOUNTER — Other Ambulatory Visit (HOSPITAL_COMMUNITY): Payer: Self-pay

## 2024-12-05 ENCOUNTER — Other Ambulatory Visit: Payer: Self-pay | Admitting: Physician Assistant

## 2024-12-22 ENCOUNTER — Telehealth: Payer: Self-pay | Admitting: Physician Assistant
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
## 2024-12-24 NOTE — Telephone Encounter (Signed)
 Appt set for 2/10

## 2024-12-26 ENCOUNTER — Telehealth: Payer: Self-pay | Admitting: Physician Assistant

## 2024-12-26 NOTE — Telephone Encounter (Signed)
 LOA ppk received from [place]. Informed pt we are waiting for her appt on 2/10 to verify if [facility] will fill it out. Advised she can pay $45 at check out.

## 2024-12-30 ENCOUNTER — Ambulatory Visit: Admitting: Physician Assistant

## 2025-04-15 ENCOUNTER — Ambulatory Visit (INDEPENDENT_AMBULATORY_CARE_PROVIDER_SITE_OTHER): Admitting: Physician Assistant
